# Patient Record
Sex: Male | Born: 1965 | Race: Black or African American | Hispanic: No | Marital: Married | Smoking: Current every day smoker
Health system: Southern US, Community
[De-identification: ages and names within clinical notes are randomized; demographics above are authoritative.]

## PROBLEM LIST (undated history)

## (undated) DIAGNOSIS — M1990 Unspecified osteoarthritis, unspecified site: Secondary | ICD-10-CM

## (undated) DIAGNOSIS — E119 Type 2 diabetes mellitus without complications: Secondary | ICD-10-CM

## (undated) DIAGNOSIS — I10 Essential (primary) hypertension: Secondary | ICD-10-CM

## (undated) HISTORY — PX: OTHER SURGICAL HISTORY: SHX169

---

## 2001-07-12 ENCOUNTER — Emergency Department (HOSPITAL_COMMUNITY): Admission: EM | Admit: 2001-07-12 | Discharge: 2001-07-12 | Payer: Self-pay | Admitting: Emergency Medicine

## 2002-09-07 ENCOUNTER — Encounter: Payer: Self-pay | Admitting: Emergency Medicine

## 2002-09-07 ENCOUNTER — Emergency Department (HOSPITAL_COMMUNITY): Admission: EM | Admit: 2002-09-07 | Discharge: 2002-09-07 | Payer: Self-pay | Admitting: Emergency Medicine

## 2003-02-22 ENCOUNTER — Encounter: Admission: RE | Admit: 2003-02-22 | Discharge: 2003-02-22 | Payer: Self-pay | Admitting: Family Medicine

## 2003-10-23 ENCOUNTER — Emergency Department (HOSPITAL_COMMUNITY): Admission: EM | Admit: 2003-10-23 | Discharge: 2003-10-23 | Payer: Self-pay | Admitting: Emergency Medicine

## 2004-05-09 ENCOUNTER — Emergency Department (HOSPITAL_COMMUNITY): Admission: EM | Admit: 2004-05-09 | Discharge: 2004-05-09 | Payer: Self-pay | Admitting: *Deleted

## 2005-04-30 ENCOUNTER — Emergency Department (HOSPITAL_COMMUNITY): Admission: EM | Admit: 2005-04-30 | Discharge: 2005-04-30 | Payer: Self-pay | Admitting: *Deleted

## 2006-09-11 ENCOUNTER — Emergency Department (HOSPITAL_COMMUNITY): Admission: EM | Admit: 2006-09-11 | Discharge: 2006-09-11 | Payer: Self-pay | Admitting: Emergency Medicine

## 2011-01-01 LAB — BASIC METABOLIC PANEL
BUN: 16
CO2: 25
Calcium: 9.4
Chloride: 106
Creatinine, Ser: 1.21

## 2011-01-01 LAB — POCT CARDIAC MARKERS
CKMB, poc: 1
Troponin i, poc: <0.05

## 2011-01-01 LAB — RAPID URINE DRUG SCREEN, HOSP PERFORMED
Barbiturates: NOT DETECTED
Cocaine: POSITIVE — AB
Opiates: POSITIVE — AB

## 2011-01-01 LAB — CBC
MCHC: 34.6
MCV: 97.6
Platelets: 228
RDW: 13.4

## 2011-01-01 LAB — D-DIMER, QUANTITATIVE: D-Dimer, Quant: <0.22

## 2012-05-01 ENCOUNTER — Encounter (HOSPITAL_COMMUNITY): Payer: Self-pay | Admitting: *Deleted

## 2012-05-01 ENCOUNTER — Emergency Department (HOSPITAL_COMMUNITY)
Admission: EM | Admit: 2012-05-01 | Discharge: 2012-05-01 | Disposition: A | Discharge status: Home / Self Care | Payer: Self-pay | Attending: Emergency Medicine | Admitting: Emergency Medicine

## 2012-05-01 DIAGNOSIS — Z23 Encounter for immunization: Secondary | ICD-10-CM | POA: Insufficient documentation

## 2012-05-01 DIAGNOSIS — S61209A Unspecified open wound of unspecified finger without damage to nail, initial encounter: Secondary | ICD-10-CM | POA: Insufficient documentation

## 2012-05-01 DIAGNOSIS — W268XXA Contact with other sharp object(s), not elsewhere classified, initial encounter: Secondary | ICD-10-CM | POA: Insufficient documentation

## 2012-05-01 DIAGNOSIS — S61011A Laceration without foreign body of right thumb without damage to nail, initial encounter: Secondary | ICD-10-CM

## 2012-05-01 DIAGNOSIS — Y92009 Unspecified place in unspecified non-institutional (private) residence as the place of occurrence of the external cause: Secondary | ICD-10-CM | POA: Insufficient documentation

## 2012-05-01 DIAGNOSIS — F17200 Nicotine dependence, unspecified, uncomplicated: Secondary | ICD-10-CM | POA: Insufficient documentation

## 2012-05-01 DIAGNOSIS — Y93G1 Activity, food preparation and clean up: Secondary | ICD-10-CM | POA: Insufficient documentation

## 2012-05-01 MED ORDER — TETANUS-DIPHTH-ACELL PERTUSSIS 5-2.5-18.5 LF-MCG/0.5 IM SUSP
0.5000 mL | Freq: Once | INTRAMUSCULAR | Status: AC
  Administered 2012-05-01: 0.5 mL via INTRAMUSCULAR
  Filled 2012-05-01: qty 0.5

## 2012-05-01 MED ORDER — NAPROXEN 500 MG PO TABS: 500.0000 mg | ORAL_TABLET | Freq: Two times a day (BID) | ORAL | Status: DC | PRN

## 2012-05-01 NOTE — ED Notes (Signed)
Pt is here with right thumb laceration from steel frying pain.  Bleeding controlled

## 2012-05-01 NOTE — ED Notes (Signed)
Patient is alert and orientedx4.  Patient was explained discharge instructions and he understood them.  Patient is being transported home by his family.

## 2012-05-03 NOTE — ED Provider Notes (Signed)
History     CSN: [id number]  Arrival date & time 05/01/12  1255   First MD Initiated Contact with Patient 05/01/12 1457      Chief Complaint  Patient presents with  . Finger Injury    (Consider location/radiation/quality/duration/timing/severity/associated sxs/prior treatment) HPI Comments: Lance Austin is a 47 y.o. Male presenting with a laceration to his distal right volar thumb when he sliced it on the edge of a metal frying pan while washing it at home.  He denies any persistent pain and has obtained hemostasis with pressure. He denies numbness distal to the laceration site and is utd with his tetanus.  He works as a Artist.     The history is provided by the patient.    History reviewed. No pertinent past medical history.  Past Surgical History  Procedure Laterality Date  . Right arm surgery      No family history on file.  History  Substance Use Topics  . Smoking status: Current Every Day Smoker  . Smokeless tobacco: Not on file  . Alcohol Use: No      Review of Systems  Constitutional: Negative for fever and chills.  HENT: Negative for facial swelling.   Respiratory: Negative for shortness of breath and wheezing.   Skin: Positive for wound.  Neurological: Negative for numbness.    Allergies  Review of patient's allergies indicates no known allergies.  Home Medications   Current Outpatient Rx  Name  Route  Sig  Dispense  Refill  . naproxen (NAPROSYN) 500 MG tablet   Oral   Take 1 tablet (500 mg total) by mouth 2 (two) times daily as needed.   15 tablet   0     BP 138/100  Pulse 86  Temp(Src) 97.2 F (36.2 C) (Oral)  Resp 18  SpO2 97%  Physical Exam  Constitutional: He is oriented to person, place, and time. He appears well-developed and well-nourished.  HENT:  Head: Normocephalic.  Cardiovascular: Normal rate.   Pulmonary/Chest: Effort normal.  Musculoskeletal: He exhibits no edema and no tenderness.   Neurological: He is alert and oriented to person, place, and time. No sensory deficit.  Skin: Laceration noted.  2 cm linear laceration,  Well approximated right volar thumb at tuft.  Less than 2 sec cap refill,  Distal sensation intact.    ED Course  Procedures (including critical care time)  LACERATION REPAIR Performed by: Burgess Amor Authorized by: Burgess Amor Consent: Verbal consent obtained. Risks and benefits: risks, benefits and alternatives were discussed Consent given by: patient Patient identity confirmed: provided demographic data Prepped and Draped in normal sterile fashion Wound explored  Laceration Location: right distal thumb  Laceration Length: 2cm  No Foreign Bodies seen or palpated  Anesthesia: digital block Local anesthetic: lidocaine 2% without epinephrine  Anesthetic total: 1.5 ml  Irrigation method: syringe Amount of cleaning: copious with NS after applying wound cleanser  Skin closure: ethilon 4-0  Number of sutures: 3  Technique: simple interrupted  Patient tolerance: Patient tolerated the procedure well with no immediate complications.   Labs Reviewed - No data to display No results found.   1. Laceration of right thumb       MDM  Suture removal in 10 days.  Signs/sx of infection discussed, return for any complications for re-eval.  Pt understands plan. Soft dressing applied by RN.  Prescribed naproxen prn pain.        Burgess Amor, PA 05/03/12 1004

## 2012-05-05 NOTE — ED Provider Notes (Signed)
Medical screening examination/treatment/procedure(s) were performed by non-physician practitioner and as supervising physician I was immediately available for consultation/collaboration.   Kristin Lamagna, MD 05/05/12 1507 

## 2013-12-10 ENCOUNTER — Emergency Department (HOSPITAL_COMMUNITY): Payer: Self-pay

## 2013-12-10 ENCOUNTER — Encounter (HOSPITAL_COMMUNITY): Payer: Self-pay | Admitting: Emergency Medicine

## 2013-12-10 ENCOUNTER — Emergency Department (HOSPITAL_COMMUNITY)
Admission: EM | Admit: 2013-12-10 | Discharge: 2013-12-10 | Disposition: A | Discharge status: Home / Self Care | Payer: Self-pay | Attending: Emergency Medicine | Admitting: Emergency Medicine

## 2013-12-10 DIAGNOSIS — W010XXA Fall on same level from slipping, tripping and stumbling without subsequent striking against object, initial encounter: Secondary | ICD-10-CM | POA: Insufficient documentation

## 2013-12-10 DIAGNOSIS — F17200 Nicotine dependence, unspecified, uncomplicated: Secondary | ICD-10-CM | POA: Insufficient documentation

## 2013-12-10 DIAGNOSIS — S42143A Displaced fracture of glenoid cavity of scapula, unspecified shoulder, initial encounter for closed fracture: Secondary | ICD-10-CM | POA: Insufficient documentation

## 2013-12-10 DIAGNOSIS — S42141A Displaced fracture of glenoid cavity of scapula, right shoulder, initial encounter for closed fracture: Secondary | ICD-10-CM

## 2013-12-10 DIAGNOSIS — S42151A Displaced fracture of neck of scapula, right shoulder, initial encounter for closed fracture: Secondary | ICD-10-CM

## 2013-12-10 DIAGNOSIS — S42153A Displaced fracture of neck of scapula, unspecified shoulder, initial encounter for closed fracture: Principal | ICD-10-CM

## 2013-12-10 DIAGNOSIS — Y9289 Other specified places as the place of occurrence of the external cause: Secondary | ICD-10-CM | POA: Insufficient documentation

## 2013-12-10 DIAGNOSIS — S46909A Unspecified injury of unspecified muscle, fascia and tendon at shoulder and upper arm level, unspecified arm, initial encounter: Secondary | ICD-10-CM | POA: Insufficient documentation

## 2013-12-10 DIAGNOSIS — Y9389 Activity, other specified: Secondary | ICD-10-CM | POA: Insufficient documentation

## 2013-12-10 DIAGNOSIS — S4980XA Other specified injuries of shoulder and upper arm, unspecified arm, initial encounter: Secondary | ICD-10-CM | POA: Insufficient documentation

## 2013-12-10 MED ORDER — ONDANSETRON 4 MG PO TBDP
4.0000 mg | ORAL_TABLET | Freq: Once | ORAL | Status: AC
  Administered 2013-12-10: 4 mg via ORAL
  Filled 2013-12-10: qty 1

## 2013-12-10 MED ORDER — MORPHINE SULFATE 10 MG/ML IJ SOLN
10.0000 mg | Freq: Once | INTRAMUSCULAR | Status: AC
Start: 2013-12-10 — End: 2013-12-10
  Administered 2013-12-10: 10 mg via INTRAMUSCULAR
  Filled 2013-12-10: qty 1

## 2013-12-10 MED ORDER — OXYCODONE-ACETAMINOPHEN 5-325 MG PO TABS: 2.0000 | ORAL_TABLET | ORAL | Status: DC | PRN

## 2013-12-10 MED ORDER — OXYCODONE-ACETAMINOPHEN 5-325 MG PO TABS
1.0000 | ORAL_TABLET | Freq: Once | ORAL | Status: AC
  Administered 2013-12-10: 1 via ORAL
  Filled 2013-12-10: qty 1

## 2013-12-10 NOTE — ED Notes (Signed)
MD at bedside. 

## 2013-12-10 NOTE — Discharge Instructions (Signed)
Sling can be removed for bathing, etc. Call Dr. Shelle Iron (Ortopedic surgeon) for a re check appointment. Scapular Fracture You have a fracture (break in bone) of your scapula. This is your shoulder blade. It is the large flat bone behind your shoulder. This is also the bone that makes up the ball and socket joint of your shoulder. Most of the time surgery is not required for injuries to this bone unless the socket of the shoulder joint is involved. DIAGNOSIS  Because of the severity of force usually required to break this bone, x-rays are often taken of other bones likely to be injured at the same time. X-rays of the hip, knee, and pelvis may be taken. Specialized x-rays (arteriograms) may be needed if there are injuries to large blood vessels associated with this injury. HOME CARE INSTRUCTIONS   Simple fractures of the scapula can be treated with a sling and swathe type of immobilization. This means the involved area is held in place by putting the arm in a sling. A wrap is made around the upper arm with the sling holding the arm next to the chest. This may be removed for bathing as instructed by your caregiver.  Apply ice to the injury for 15-20 minutes 03-04 times per day. Put the ice in a plastic bag. Place a towel between the bag of ice and your skin, splint, or immobilization device.  Do not resume use until instructed by your caregiver. Usually full rehabilitation (exercises to improve the injury site) will begin sometime after the sling and swathe are removed. Then begin use gradually as directed. Do not increase use to the point of pain. If pain develops, decrease use and continue the above measures. Slowly increase activities that do not cause discomfort until you gradually achieve normal use without pain.  Only take over-the-counter or prescription medicines for pain, discomfort, or fever as directed by your caregiver. SEEK IMMEDIATE MEDICAL CARE IF:   Your pain and swelling increase and is  uncontrolled with medications.  You develop new, unexplained symptoms or an increase of the symptoms which brought you to your caregiver.  You develop shortness or breath or cough up blood.  You are unable to move your arm or fingers. You develop warmth and swelling in your affected arm.  You develop an unexplained temperature. Document Released: 03/03/2005 Document Revised: 05/26/2011 Document Reviewed: 01/23/2006 [facility] Patient Information 2015 [place], [place]. This information is not intended to replace advice given to you by your health care provider. Make sure you discuss any questions you have with your health care provider.

## 2013-12-10 NOTE — ED Notes (Signed)
Patient arrives with complaint of right shoulder pain secondary to falling on outstretched hand. States that he was "jogging to the bathroom" and fell. Since that time shoulder has been very painful to move. Denies increase in pain with palpation. Patient unable to lift arm above mid abdomen straightening arm increases pain.

## 2013-12-10 NOTE — ED Provider Notes (Signed)
CSN: [id number]     Arrival date & time 12/10/13  0524 History   First MD Initiated Contact with Patient 12/10/13 [phone]     Chief Complaint  Patient presents with  . Shoulder Pain      HPI  Patient presents with right shoulder pain. He was going quickly to the bathroom at 10:00 last night when he tripped and fell on outstretched arm he complains of pain in the shoulder. Painful to move. He slept poorly because of this. No areas of pain to the distal upper extremity. No injury to the head neck back or chest.  History reviewed. No pertinent past medical history. Past Surgical History  Procedure Laterality Date  . Right arm surgery     No family history on file. History  Substance Use Topics  . Smoking status: Current Every Day Smoker -- 0.33 packs/day    Types: Cigarettes  . Smokeless tobacco: Not on file  . Alcohol Use: No    Review of Systems  Constitutional: Negative for fever, chills, diaphoresis, appetite change and fatigue.  HENT: Negative for mouth sores, sore throat and trouble swallowing.   Eyes: Negative for visual disturbance.  Respiratory: Negative for cough, chest tightness, shortness of breath and wheezing.   Cardiovascular: Negative for chest pain.  Gastrointestinal: Negative for nausea, vomiting, abdominal pain, diarrhea and abdominal distention.  Endocrine: Negative for polydipsia, polyphagia and polyuria.  Genitourinary: Negative for dysuria, frequency and hematuria.  Musculoskeletal: Positive for arthralgias and myalgias. Negative for gait problem.  Skin: Negative for color change, pallor and rash.  Neurological: Negative for dizziness, syncope, light-headedness and headaches.  Hematological: Does not bruise/bleed easily.  Psychiatric/Behavioral: Negative for behavioral problems and confusion.      Allergies  Review of patient's allergies indicates no known allergies.  Home Medications   Prior to Admission medications   Medication Sig Start Date End  Date Taking? Authorizing Provider  ibuprofen (ADVIL,MOTRIN) 200 MG tablet Take 200 mg by mouth every 6 (six) hours as needed for moderate pain.   Yes Historical Provider, MD  oxyCODONE-acetaminophen (PERCOCET/ROXICET) 5-325 MG per tablet Take 2 tablets by mouth every 4 (four) hours as needed. 12/10/13   Rolland Porter, MD   BP 130/92  Pulse 85  Temp(Src) 98.5 F (36.9 C) (Oral)  Resp 20  Ht  (1.88 m)  Wt 240 lb (108.863 kg)  BMI 30.80 kg/m2  SpO2 94% Physical Exam  Constitutional: He is oriented to person, place, and time. He appears well-developed and well-nourished. No distress.  HENT:  Head: Normocephalic.  Eyes: Conjunctivae are normal. Pupils are equal, round, and reactive to light. No scleral icterus.  Neck: Normal range of motion. Neck supple. No thyromegaly present.  Cardiovascular: Normal rate and regular rhythm.  Exam reveals no gallop and no friction rub.   No murmur heard. Pulmonary/Chest: Effort normal and breath sounds normal. No respiratory distress. He has no wheezes. He has no rales.  Abdominal: Soft. Bowel sounds are normal. He exhibits no distension. There is no tenderness. There is no rebound.  Musculoskeletal: Normal range of motion.       Arms: Neurological: He is alert and oriented to person, place, and time.  Skin: Skin is warm and dry. No rash noted.  Psychiatric: He has a normal mood and affect. His behavior is normal.    ED Course  Procedures (including critical care time) Labs Review Labs Reviewed - No data to display  Imaging Review Dg Shoulder Right  12/10/2013   CLINICAL  DATA:  Right shoulder pain.  Slipped and fell yesterday.  EXAM: RIGHT SHOULDER - 2+ VIEW  COMPARISON:  None.  FINDINGS: There is a Bankart type fracture of the inferior glenoid rim. No evidence of shoulder dislocation. Slightly increased subacromial space is probably projectional. Soft tissues are unremarkable.  IMPRESSION: Bankart type fracture of the inferior glenoid rim. No  glenohumeral dislocation is appreciated.   Electronically Signed   By: Burman Nieves M.D.   On: 12/10/2013 06:27     EKG Interpretation None      MDM   Final diagnoses:  Glenoid fracture of shoulder, right, closed, initial encounter     X-rays reviewed with patient.  Inferior Glenoid Fracture demonstrated.  Placed into sling. Discussed limited use and ortho follow up.  Given IM meds for pain.   Rolland Porter, MD 12/10/13 [phone]

## 2015-10-14 ENCOUNTER — Encounter (HOSPITAL_COMMUNITY): Payer: Self-pay

## 2015-10-14 ENCOUNTER — Emergency Department (HOSPITAL_COMMUNITY)
Admission: EM | Admit: 2015-10-14 | Discharge: 2015-10-14 | Disposition: A | Discharge status: Home / Self Care | Payer: No Typology Code available for payment source | Attending: Emergency Medicine | Admitting: Emergency Medicine

## 2015-10-14 DIAGNOSIS — Y939 Activity, unspecified: Secondary | ICD-10-CM | POA: Diagnosis not present

## 2015-10-14 DIAGNOSIS — F17210 Nicotine dependence, cigarettes, uncomplicated: Secondary | ICD-10-CM | POA: Diagnosis not present

## 2015-10-14 DIAGNOSIS — S40812A Abrasion of left upper arm, initial encounter: Secondary | ICD-10-CM | POA: Diagnosis not present

## 2015-10-14 DIAGNOSIS — M79622 Pain in left upper arm: Secondary | ICD-10-CM

## 2015-10-14 DIAGNOSIS — Y999 Unspecified external cause status: Secondary | ICD-10-CM | POA: Insufficient documentation

## 2015-10-14 DIAGNOSIS — Y92410 Unspecified street and highway as the place of occurrence of the external cause: Secondary | ICD-10-CM | POA: Diagnosis not present

## 2015-10-14 DIAGNOSIS — S4992XA Unspecified injury of left shoulder and upper arm, initial encounter: Secondary | ICD-10-CM | POA: Diagnosis present

## 2015-10-14 MED ORDER — METHOCARBAMOL 500 MG PO TABS: 500.0000 mg | ORAL_TABLET | Freq: Two times a day (BID) | ORAL | 0 refills | Status: DC

## 2015-10-14 MED ORDER — IBUPROFEN 600 MG PO TABS: 600.0000 mg | ORAL_TABLET | Freq: Four times a day (QID) | ORAL | 0 refills | Status: DC | PRN

## 2015-10-14 MED ORDER — KETOROLAC TROMETHAMINE 30 MG/ML IJ SOLN
30.0000 mg | Freq: Once | INTRAMUSCULAR | Status: AC
  Administered 2015-10-14: 30 mg via INTRAMUSCULAR
  Filled 2015-10-14: qty 1

## 2015-10-14 NOTE — Discharge Instructions (Signed)
Take your medications as prescribed and as we discussed. Follow up with your doctor as needed. Return to ED for new or worsening symptoms.
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
## 2017-07-28 ENCOUNTER — Other Ambulatory Visit: Payer: Self-pay

## 2017-07-28 ENCOUNTER — Emergency Department (HOSPITAL_COMMUNITY): Payer: Self-pay

## 2017-07-28 ENCOUNTER — Emergency Department (HOSPITAL_BASED_OUTPATIENT_CLINIC_OR_DEPARTMENT_OTHER)
Admit: 2017-07-28 | Discharge: 2017-07-28 | Disposition: A | Discharge status: Home / Self Care | Payer: Self-pay | Attending: Emergency Medicine | Admitting: Emergency Medicine

## 2017-07-28 ENCOUNTER — Emergency Department (HOSPITAL_COMMUNITY)
Admission: EM | Admit: 2017-07-28 | Discharge: 2017-07-28 | Disposition: A | Discharge status: Home / Self Care | Payer: Self-pay | Attending: Emergency Medicine | Admitting: Emergency Medicine

## 2017-07-28 ENCOUNTER — Encounter (HOSPITAL_COMMUNITY): Payer: Self-pay | Admitting: Emergency Medicine

## 2017-07-28 DIAGNOSIS — M79652 Pain in left thigh: Secondary | ICD-10-CM | POA: Insufficient documentation

## 2017-07-28 DIAGNOSIS — Z79899 Other long term (current) drug therapy: Secondary | ICD-10-CM | POA: Insufficient documentation

## 2017-07-28 DIAGNOSIS — F17210 Nicotine dependence, cigarettes, uncomplicated: Secondary | ICD-10-CM | POA: Insufficient documentation

## 2017-07-28 DIAGNOSIS — M79609 Pain in unspecified limb: Secondary | ICD-10-CM

## 2017-07-28 DIAGNOSIS — R52 Pain, unspecified: Secondary | ICD-10-CM

## 2017-07-28 NOTE — ED Triage Notes (Signed)
Pt reports pain in left thigh, states he was not working for a while and now he is standing on his feet a lot for his job, states after standing for a while his thigh feels numb and hot.

## 2017-07-28 NOTE — Discharge Instructions (Addendum)
Please read attached information. If you experience any new or worsening signs or symptoms please return to the emergency room for evaluation. Please follow-up with your primary care provider or specialist as discussed.  °

## 2017-07-28 NOTE — ED Notes (Signed)
Pt to vascular.

## 2017-07-28 NOTE — Progress Notes (Signed)
*  Preliminary Results* Left lower extremity venous duplex completed. Left lower extremity is negative for deep vein thrombosis. There is no evidence of left Baker's cyst.  07/28/2017 2:27 PM  Gertie Fey, BS, RVT, RDCS, RDMS

## 2017-07-28 NOTE — ED Provider Notes (Signed)
[facility] EMERGENCY DEPARTMENT Provider Note   CSN: [id number] Arrival date & time: 07/28/17  1011     History   Chief Complaint Chief Complaint  Patient presents with  . Leg Pain    HPI SKANDA WORLDS is a 52 y.o. male.  HPI   52 year old male presents today with complaints of left thigh pain.  Patient notes he recently went back to work and is standing for prolonged periods of time.  He notes that he has pain in his left anterior thigh, has some numbness along the anterior aspect of the thigh that comes and goes and is improved with movement.  He denies any lower extremity swelling or edema, denies any pain to the posterior aspect of the thigh or calf, denies any history of DVT or PE.  Patient does note he is a smoker.  Denies any trauma to the lower extremity.  He does note very minor lower back pain intermittent, nonsevere.  As of strength or motor function of the lower extremity.  History reviewed. No pertinent past medical history.  There are no active problems to display for this patient.   Past Surgical History:  Procedure Laterality Date  . right arm surgery          Home Medications    Prior to Admission medications   Medication Sig Start Date End Date Taking? Authorizing Provider  ibuprofen (ADVIL,MOTRIN) 200 MG tablet Take 200 mg by mouth every 6 (six) hours as needed for moderate pain.    [provider]  ibuprofen (ADVIL,MOTRIN) 600 MG tablet Take 1 tablet (600 mg total) by mouth every 6 (six) hours as needed. 10/14/15   Cartner, Sharlet Salina, PA-C  methocarbamol (ROBAXIN) 500 MG tablet Take 1 tablet (500 mg total) by mouth 2 (two) times daily. 10/14/15   Joycie Peek, PA-C  oxyCODONE-acetaminophen (PERCOCET/ROXICET) 5-325 MG per tablet Take 2 tablets by mouth every 4 (four) hours as needed. 12/10/13   Rolland Porter, MD    Family History No family history on file.  Social History Social History   Tobacco Use  . Smoking  status: Current Every Day Smoker    Packs/day: 0.33    Types: Cigarettes  . Smokeless tobacco: Never Used  Substance Use Topics  . Alcohol use: No  . Drug use: No     Allergies   Patient has no known allergies.   Review of Systems Review of Systems  All other systems reviewed and are negative.    Physical Exam Updated Vital Signs BP (!) 147/84 (BP Location: Right Arm)   Pulse 75   Temp 98.7 F (37.1 C) (Oral)   Resp 20   SpO2 100%   Physical Exam  Constitutional: He is oriented to person, place, and time. He appears well-developed and well-nourished.  HENT:  Head: Normocephalic and atraumatic.  Eyes: Pupils are equal, round, and reactive to light. Conjunctivae are normal. Right eye exhibits no discharge. Left eye exhibits no discharge. No scleral icterus.  Neck: Normal range of motion. No JVD present. No tracheal deviation present.  Pulmonary/Chest: Effort normal. No stridor.  Musculoskeletal:  No CT or L-spine tenderness palpation, minor tenderness palpation of left lateral lumbar musculature-left leg atraumatic no swelling or edema, no warmth to touch, nontender full active range of motion no pain with hip flexion extension, extensor flexion of the knee, joint compartments are soft   Neurological: He is alert and oriented to person, place, and time. Coordination normal.  Psychiatric: He has a  normal mood and affect. His behavior is normal. Judgment and thought content normal.  Nursing note and vitals reviewed.    ED Treatments / Results  Labs (all labs ordered are listed, but only abnormal results are displayed) Labs Reviewed - No data to display  EKG None  Radiology Dg Femur Min 2 Views Left  Result Date: 07/28/2017 CLINICAL DATA:  Left femur pain for 1 month without known injury. EXAM: LEFT FEMUR 2 VIEWS COMPARISON:  None. FINDINGS: There is no evidence of fracture or other focal bone lesions. Soft tissues are unremarkable. IMPRESSION: Normal left femur.  Electronically Signed   By: Lupita Raider, M.D.   On: 07/28/2017 12:40    Procedures Procedures (including critical care time)  Medications Ordered in ED Medications - No data to display   Initial Impression / Assessment and Plan / ED Course  I have reviewed the triage vital signs and the nursing notes.  Pertinent labs & imaging results that were available during my care of the patient were reviewed by me and considered in my medical decision making (see chart for details).     52 year old male presents today with complaints of thigh pain.  This is likely muscular in nature, DVT study with no acute DVT x-ray without acute finding.  No signs of infection or swelling on my exam.  Patient does have some minor lower back pain question some radiation of symptoms, no significant deficits here today.  Patient will be encouraged to follow-up as an outpatient, he is given strict return precautions, he verbalized understanding and agreement to today's plan had no further questions or concerns at time of discharge.  Final Clinical Impressions(s) / ED Diagnoses   Final diagnoses:  Pain of left thigh    ED Discharge Orders    None       Rosalio Loud 07/28/17 1651    Rolland Porter, MD 08/03/17 [phone]

## 2018-01-29 ENCOUNTER — Other Ambulatory Visit: Payer: Self-pay

## 2018-01-29 ENCOUNTER — Ambulatory Visit (HOSPITAL_COMMUNITY)
Admission: EM | Admit: 2018-01-29 | Discharge: 2018-01-29 | Disposition: A | Discharge status: Home / Self Care | Payer: BLUE CROSS/BLUE SHIELD | Attending: Family Medicine | Admitting: Family Medicine

## 2018-01-29 ENCOUNTER — Encounter (HOSPITAL_COMMUNITY): Payer: Self-pay | Admitting: Emergency Medicine

## 2018-01-29 DIAGNOSIS — M25561 Pain in right knee: Secondary | ICD-10-CM

## 2018-01-29 MED ORDER — TRAMADOL HCL 50 MG PO TABS: 50.0000 mg | ORAL_TABLET | Freq: Four times a day (QID) | ORAL | 0 refills | Status: DC | PRN

## 2018-01-29 MED ORDER — DICLOFENAC SODIUM 75 MG PO TBEC: 75.0000 mg | DELAYED_RELEASE_TABLET | Freq: Two times a day (BID) | ORAL | 0 refills | Status: DC

## 2018-01-29 NOTE — ED Provider Notes (Signed)
[facility]   [id number] 01/29/18 Arrival Time: 1638  ASSESSMENT & PLAN:  1. Right knee pain, unspecified chronicity   Non-traumatic. 3-4 months. MCL distribution.  Meds ordered this encounter  Medications  . diclofenac (VOLTAREN) 75 MG EC tablet    Sig: Take 1 tablet (75 mg total) by mouth 2 (two) times daily.    Dispense:  20 tablet    Refill:  0  . traMADol (ULTRAM) 50 MG tablet    Sig: Take 1 tablet (50 mg total) by mouth every 6 (six) hours as needed.    Dispense:  12 tablet    Refill:  0   Orders Placed This Encounter  Procedures  . Apply knee sleeve   Follow-up Information    Schedule an appointment as soon as possible for a visit  with [facility]   Specialty:  Orthopedic Surgery Contact information: [address] [place] [address] [phone]          Rest the injured area as much as practical.  [place] Controlled Substances Registry consulted for this patient. I feel the risk/benefit ratio today is favorable for proceeding with this prescription for a controlled substance. Medication sedation precautions given.  Reviewed expectations re: course of current medical issues. Questions answered. Outlined signs and symptoms indicating need for more acute intervention. Patient verbalized understanding. After Visit Summary given.  SUBJECTIVE: History from: patient. Lance Austin is a 52 y.o. male who reports intermittent mild to moderate pain of his right medial knee pain; described as aching without radiation. Injury/trama: no. Onset: gradual, over the past 3-4 months. Stands on concrete at work; questions relation. Symptoms have been intermittent since beginning. More persistent over the past few weeks. Relieved by: rest. Worsened by: prolonged standing/walking. Associated symptoms: none reported. No swelling of R knee. Extremity sensation changes or weakness: none. Self treatment: has not tried OTCs for relief  of pain. History of similar: no.  Past Surgical History:  Procedure Laterality Date  . right arm surgery       ROS: As per HPI.   OBJECTIVE:  Vitals:   01/29/18 1731  BP: (!) 132/91  Pulse: (!) 103  Temp: 98.6 F (37 C)  TempSrc: Oral  SpO2: 100%   Recheck pulse: 96 and regular  General appearance: alert; no distress Extremities: . RLE: warm and well perfused; fairly well localized moderate tenderness over right medial knee, over MCL distribution; without gross deformities; with no swelling; with no bruising; ROM: normal CV: brisk extremity capillary refill of RLE and LLE; 2+ DP and PT pulse of RLE and LLE. Skin: warm and dry; no visible rashes Neurologic: gait normal; normal reflexes of RLE and LLE; normal sensation of RLE and LLE; normal strength of RLE and LLE Psychological: alert and cooperative; normal mood and affect  No Known Allergies  History reviewed. No pertinent past medical history. Social History   Socioeconomic History  . Marital status: Married    Spouse name: Not on file  . Number of children: Not on file  . Years of education: Not on file  . Highest education level: Not on file  Occupational History  . Not on file  Social Needs  . Financial resource strain: Not on file  . Food insecurity:    Worry: Not on file    Inability: Not on file  . Transportation needs:    Medical: Not on file    Non-medical: Not on file  Tobacco Use  . Smoking status:  Current Every Day Smoker    Packs/day: 0.33    Types: Cigarettes  . Smokeless tobacco: Never Used  Substance and Sexual Activity  . Alcohol use: No  . Drug use: No  . Sexual activity: Not on file  Lifestyle  . Physical activity:    Days per week: Not on file    Minutes per session: Not on file  . Stress: Not on file  Relationships  . Social connections:    Talks on phone: Not on file    Gets together: Not on file    Attends religious service: Not on file    Active member of club or  organization: Not on file    Attends meetings of clubs or organizations: Not on file    Relationship status: Not on file  Other Topics Concern  . Not on file  Social History Narrative  . Not on file   History reviewed. No pertinent family history. Past Surgical History:  Procedure Laterality Date  . right arm surgery        Mardella Layman, MD 01/29/18 [phone]

## 2018-01-29 NOTE — ED Triage Notes (Signed)
Pt has been suffering from right knee pain for four months.  Pt states it recently has been keeping him up at night.  He denies any injury to the knee.

## 2018-04-01 ENCOUNTER — Encounter (HOSPITAL_COMMUNITY): Payer: Self-pay | Admitting: Emergency Medicine

## 2018-04-01 ENCOUNTER — Ambulatory Visit (HOSPITAL_COMMUNITY)
Admission: EM | Admit: 2018-04-01 | Discharge: 2018-04-01 | Disposition: A | Discharge status: Home / Self Care | Payer: BLUE CROSS/BLUE SHIELD | Attending: Family Medicine | Admitting: Family Medicine

## 2018-04-01 ENCOUNTER — Ambulatory Visit (INDEPENDENT_AMBULATORY_CARE_PROVIDER_SITE_OTHER): Payer: BLUE CROSS/BLUE SHIELD

## 2018-04-01 DIAGNOSIS — R030 Elevated blood-pressure reading, without diagnosis of hypertension: Secondary | ICD-10-CM | POA: Insufficient documentation

## 2018-04-01 DIAGNOSIS — R0602 Shortness of breath: Secondary | ICD-10-CM | POA: Diagnosis not present

## 2018-04-01 DIAGNOSIS — J22 Unspecified acute lower respiratory infection: Secondary | ICD-10-CM | POA: Insufficient documentation

## 2018-04-01 DIAGNOSIS — R05 Cough: Secondary | ICD-10-CM

## 2018-04-01 MED ORDER — IPRATROPIUM BROMIDE 0.06 % NA SOLN: 2.0000 | Freq: Four times a day (QID) | NASAL | 12 refills | Status: DC

## 2018-04-01 MED ORDER — AMOXICILLIN 875 MG PO TABS: 875.0000 mg | ORAL_TABLET | Freq: Two times a day (BID) | ORAL | 0 refills | Status: DC

## 2018-04-01 MED ORDER — BENZONATATE 200 MG PO CAPS: 200.0000 mg | ORAL_CAPSULE | Freq: Two times a day (BID) | ORAL | 0 refills | Status: DC | PRN

## 2018-04-01 NOTE — ED Triage Notes (Signed)
Pt presents to [facility] for assessment of cough, congestion, body aches, headaches, and chills since Sunday.

## 2018-04-01 NOTE — Discharge Instructions (Signed)
Rest Push fluids Take the amoxil 2 x a day Take the tessalon 2 x a day for cough Use the nasal spray for the running nose Avoid cold medicine with decongestants due to elevated blood pressure Follow up with a PCP

## 2018-04-01 NOTE — ED Provider Notes (Signed)
[facility]    CSN: [id number] Arrival date & time: 04/01/18  1110     History   Chief Complaint Chief Complaint  Patient presents with  . Flu-Like Symptoms    HPI Lance Austin is a 53 y.o. male.   HPI  Patient states that he has had congestion, body aches, cough, headaches, chills and fever since Saturday.  This is been going on for 5 days.  He states that he is coughing up a lot of congestion, is feeling very tired.  He states that he is a smoker and has been told he has COPD.  He does not have a primary care doctor but his prior doctor told him that he needed antibiotics when he had chest congestion.  He has some nausea but no vomiting.  He is very tired.  He has been able to work.  History reviewed. No pertinent past medical history.  There are no active problems to display for this patient.   Past Surgical History:  Procedure Laterality Date  . right arm surgery         Home Medications    Prior to Admission medications   Medication Sig Start Date End Date Taking? Authorizing Provider  amoxicillin (AMOXIL) 875 MG tablet Take 1 tablet (875 mg total) by mouth 2 (two) times daily. 04/01/18   Eustace MooreNelson, Sheera Illingworth Sue, MD  benzonatate (TESSALON) 200 MG capsule Take 1 capsule (200 mg total) by mouth 2 (two) times daily as needed for cough. 04/01/18   Eustace MooreNelson, Lucius Wise Sue, MD  diclofenac (VOLTAREN) 75 MG EC tablet Take 1 tablet (75 mg total) by mouth 2 (two) times daily. 01/29/18   Mardella LaymanHagler, Brian, MD  ipratropium (ATROVENT) 0.06 % nasal spray Place 2 sprays into both nostrils 4 (four) times daily. 04/01/18   Eustace MooreNelson, Shamecca Whitebread Sue, MD  traMADol (ULTRAM) 50 MG tablet Take 1 tablet (50 mg total) by mouth every 6 (six) hours as needed. 01/29/18   Mardella LaymanHagler, Brian, MD    Family History History reviewed. No pertinent family history.  Social History Social History   Tobacco Use  . Smoking status: Current Every Day Smoker    Packs/day: 0.33    Types: Cigarettes  . Smokeless  tobacco: Never Used  Substance Use Topics  . Alcohol use: No  . Drug use: No     Allergies   Patient has no known allergies.   Review of Systems Review of Systems  Constitutional: Positive for chills, fatigue and fever.  HENT: Positive for congestion. Negative for ear pain and sore throat.   Eyes: Negative for pain and visual disturbance.  Respiratory: Positive for cough and wheezing. Negative for shortness of breath.   Cardiovascular: Negative for chest pain and palpitations.  Gastrointestinal: Negative for abdominal pain and vomiting.  Genitourinary: Negative for dysuria and hematuria.  Musculoskeletal: Positive for myalgias. Negative for arthralgias and back pain.  Skin: Negative for color change and rash.  Neurological: Negative for seizures and syncope.  All other systems reviewed and are negative.    Physical Exam Triage Vital Signs ED Triage Vitals  Enc Vitals Group     BP 04/01/18 1143 (!) 169/110     Pulse Rate 04/01/18 1143 78     Resp 04/01/18 1143 20     Temp 04/01/18 1143 97.6 F (36.4 C)     Temp Source 04/01/18 1143 Temporal     SpO2 04/01/18 1143 100 %     Weight --      Height --  Head Circumference --      Peak Flow --      Pain Score 04/01/18 1144 10     Pain Loc --      Pain Edu? --      Excl. in GC? --    No data found.  Updated Vital Signs BP (!) 169/110 (BP Location: Right Arm)   Pulse 78   Temp 97.6 F (36.4 C) (Temporal)   Resp 20   SpO2 100%     Physical Exam Constitutional:      General: He is not in acute distress.    Appearance: He is well-developed.  HENT:     Head: Normocephalic and atraumatic.     Right Ear: Tympanic membrane, ear canal and external ear normal.     Left Ear: Tympanic membrane, ear canal and external ear normal.     Nose: Rhinorrhea present.     Mouth/Throat:     Pharynx: Posterior oropharyngeal erythema present.  Eyes:     Conjunctiva/sclera: Conjunctivae normal.     Pupils: Pupils are equal,  round, and reactive to light.  Neck:     Musculoskeletal: Normal range of motion and neck supple.  Cardiovascular:     Rate and Rhythm: Normal rate and regular rhythm.     Heart sounds: Normal heart sounds.  Pulmonary:     Effort: Pulmonary effort is normal. No respiratory distress.     Breath sounds: Wheezing and rhonchi present.     Comments: Coarse lung sounds, scattered rhonchi, few wheeze Abdominal:     General: Abdomen is flat. There is no distension.     Palpations: Abdomen is soft.  Musculoskeletal: Normal range of motion.  Skin:    General: Skin is warm and dry.  Neurological:     General: No focal deficit present.     Mental Status: He is alert. Mental status is at baseline.  Psychiatric:        Mood and Affect: Mood normal.        Thought Content: Thought content normal.      UC Treatments / Results  Labs (all labs ordered are listed, but only abnormal results are displayed) Labs Reviewed - No data to display  EKG None  Radiology Dg Chest 2 View  Result Date: 04/01/2018 CLINICAL DATA:  Dry cough, shortness of breath and decreased appetite for 1 week. EXAM: CHEST - 2 VIEW COMPARISON:  PA and lateral chest 09/11/2006. FINDINGS: Mild, chronic coarsening of the pulmonary interstitium is seen. Lungs clear. No pneumothorax or pleural effusion. Aortic atherosclerosis is noted. Heart size is normal. No acute or focal bony abnormality. IMPRESSION: No acute disease. Atherosclerosis. Electronically Signed   By: Drusilla Kanner M.D.   On: 04/01/2018 13:10    Procedures Procedures (including critical care time)  Medications Ordered in UC Medications - No data to display  Initial Impression / Assessment and Plan / UC Course  I have reviewed the triage vital signs and the nursing notes.  Pertinent labs & imaging results that were available during my care of the patient were reviewed by me and considered in my medical decision making (see chart for details).     I  discussed with the patient that he needs to quit smoking.  He has a lower respiratory tract infection.  It is likely caused by a virus, but given his lung congestion and abnormal lung sounds it is not unreasonable to give him an antibiotic.  He has atherosclerotic disease and hypertension.  He needs to see a PCP for this.  His blood pressure is not dangerously elevated, but it is been high the last couple of visits and he needs to follow-up on this.  Today, I am not going to offer him treatment, because he is sleep deprived and taking cold medications which may contribute to the elevated pressure. Final Clinical Impressions(s) / UC Diagnoses   Final diagnoses:  LRTI (lower respiratory tract infection)  Elevated blood pressure reading without diagnosis of hypertension     Discharge Instructions     Rest Push fluids Take the amoxil 2 x a day Take the tessalon 2 x a day for cough Use the nasal spray for the running nose Avoid cold medicine with decongestants due to elevated blood pressure Follow up with a PCP   ED Prescriptions    Medication Sig Dispense Auth. Provider   amoxicillin (AMOXIL) 875 MG tablet Take 1 tablet (875 mg total) by mouth 2 (two) times daily. 14 tablet Eustace MooreNelson, Ranyia Witting Sue, MD   benzonatate (TESSALON) 200 MG capsule Take 1 capsule (200 mg total) by mouth 2 (two) times daily as needed for cough. 20 capsule Eustace MooreNelson, Gyasi Hazzard Sue, MD   ipratropium (ATROVENT) 0.06 % nasal spray Place 2 sprays into both nostrils 4 (four) times daily. 15 mL Eustace MooreNelson, Adreanna Fickel Sue, MD     Controlled Substance Prescriptions [place] Controlled Substance Registry consulted? Not Applicable   Eustace MooreNelson, Dezmen Alcock Sue, MD 04/01/18 2152

## 2020-01-27 ENCOUNTER — Ambulatory Visit
Admission: RE | Admit: 2020-01-27 | Discharge: 2020-01-27 | Disposition: A | Discharge status: Home / Self Care | Payer: Disability Insurance | Source: Ambulatory Visit | Attending: Dentistry | Admitting: Dentistry

## 2020-01-27 ENCOUNTER — Other Ambulatory Visit: Payer: Self-pay | Admitting: Dentistry

## 2020-01-27 DIAGNOSIS — M79661 Pain in right lower leg: Secondary | ICD-10-CM | POA: Insufficient documentation

## 2020-01-27 DIAGNOSIS — M545 Low back pain, unspecified: Secondary | ICD-10-CM

## 2020-01-27 DIAGNOSIS — M79662 Pain in left lower leg: Secondary | ICD-10-CM

## 2020-01-27 DIAGNOSIS — R200 Anesthesia of skin: Secondary | ICD-10-CM | POA: Diagnosis present

## 2020-11-23 IMAGING — CR DG LUMBAR SPINE 2-3V
1 series · 3 of 3 positions shown · non-contrast
Comparison: None.

CLINICAL DATA: Bilateral low back pain and numbness in the legs

EXAM:
LUMBAR SPINE - 2-3 VIEW

[Series 1: dg lumbar spine 2-3 views · 0.14mm/px · 3 of 3 slices shown]
[im 1/3]
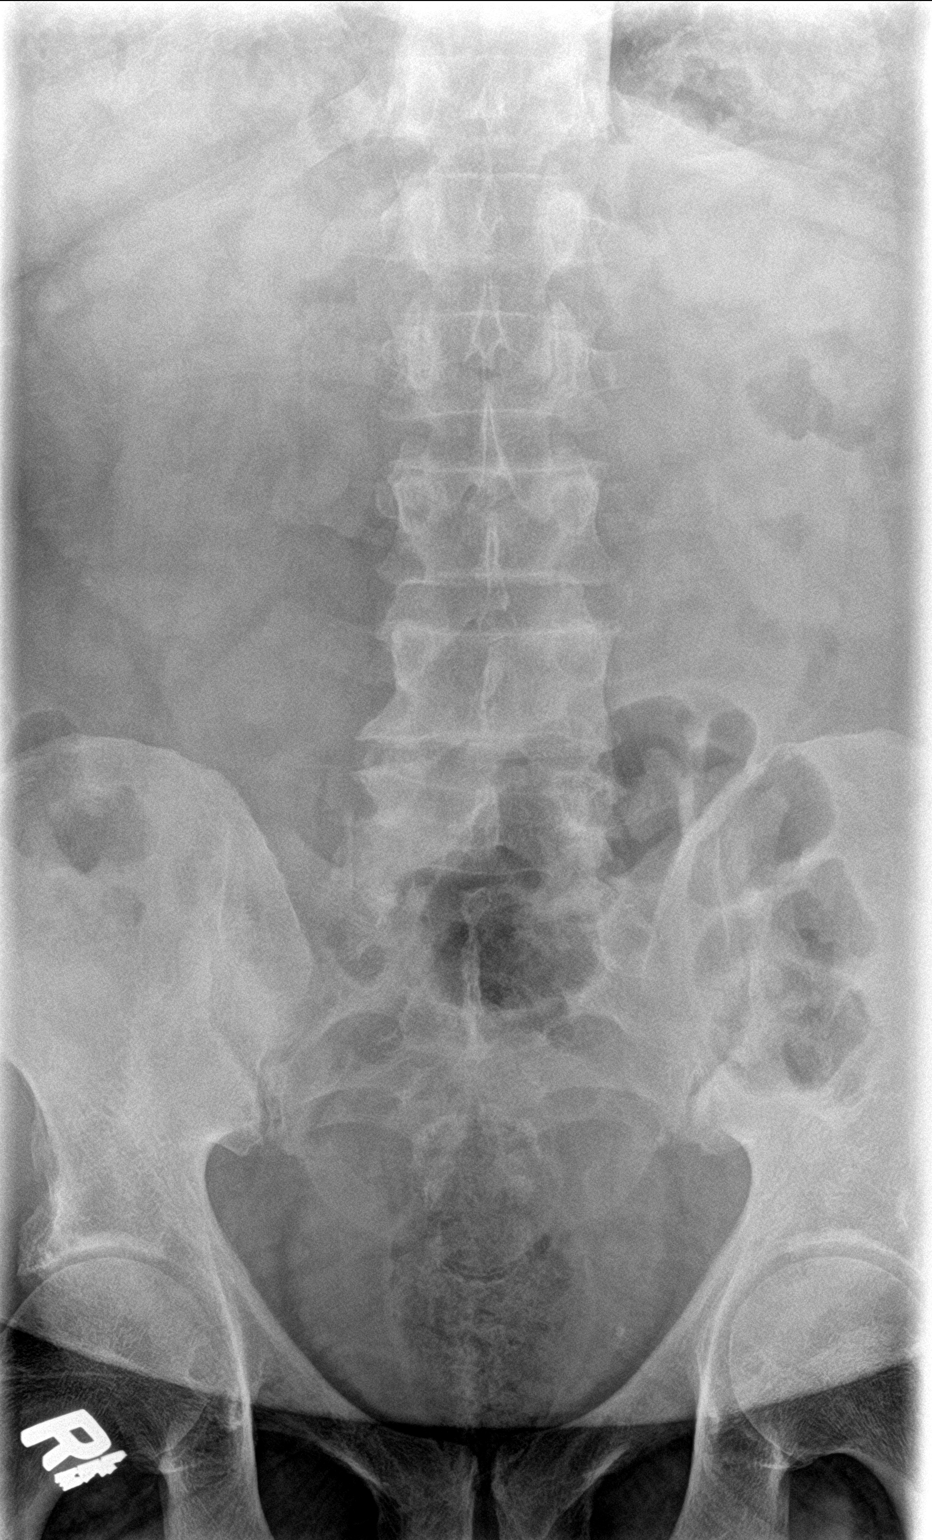
[im 2/3]
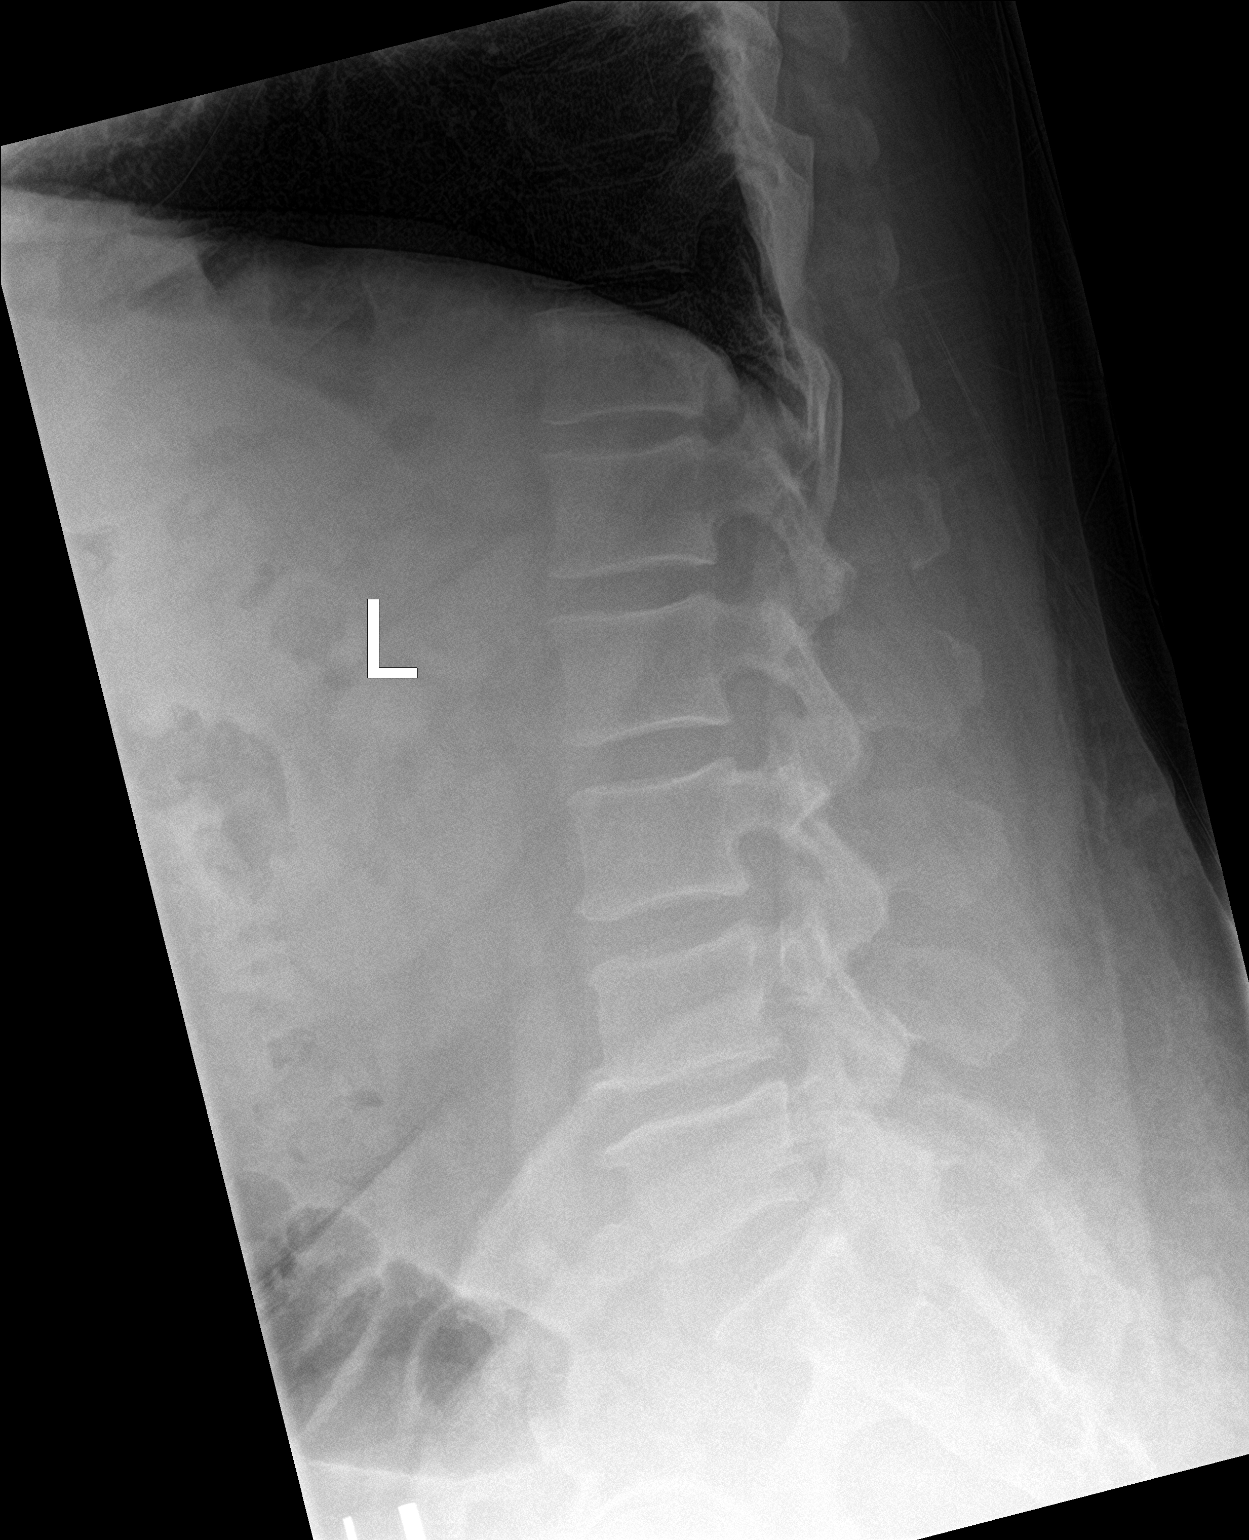
[im 3/3]
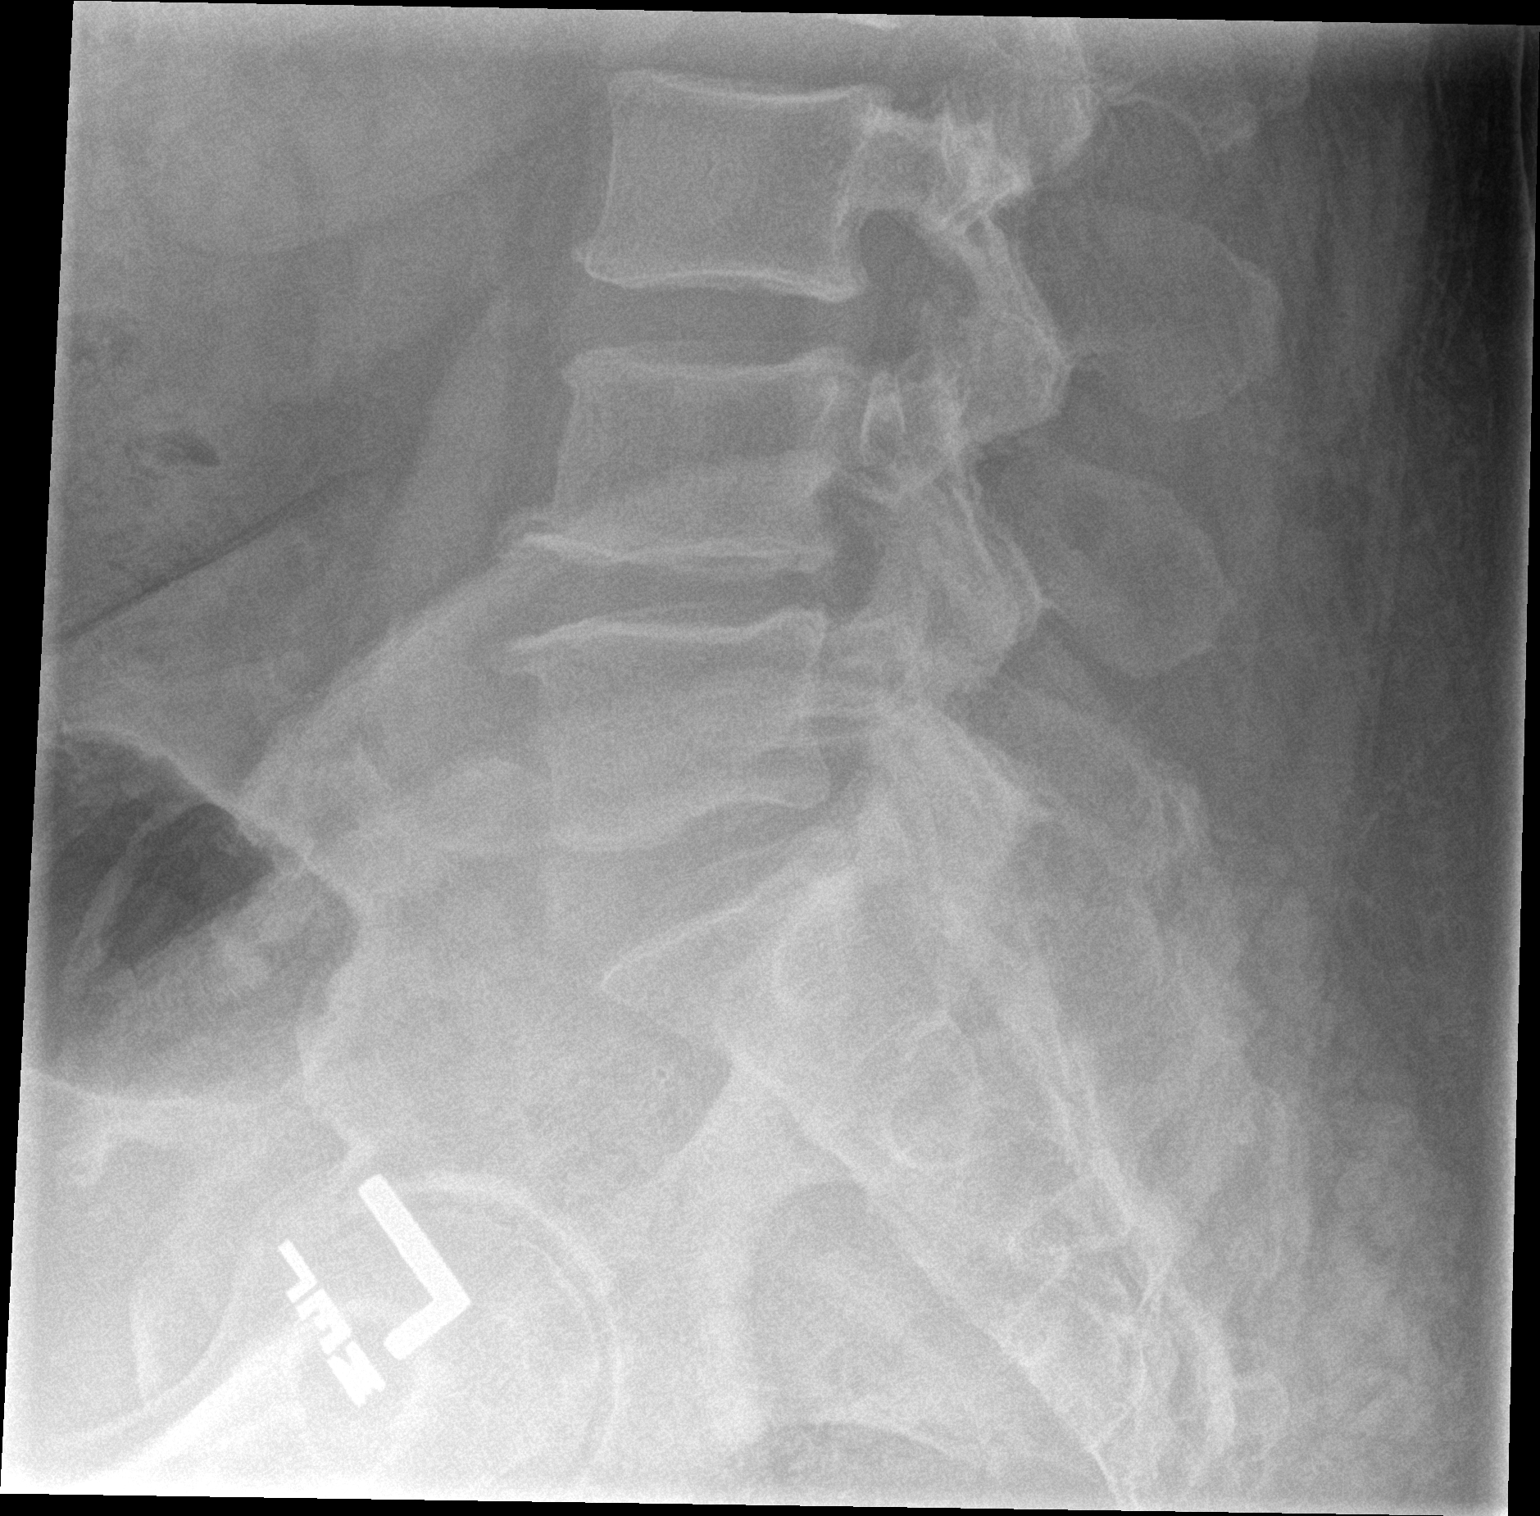

[3 of 3 positions shown; findings below may reference images not displayed]

FINDINGS: Alignment within normal limits. Vertebral body heights are
maintained. Mild degenerative changes at L4-L5.
IMPRESSION: Mild degenerative changes at L4-L5.

## 2020-11-23 IMAGING — CR DG KNEE 1-2V*R*
1 series · 2 of 2 positions shown · non-contrast
Comparison: None.

CLINICAL DATA: Pain and numbness in both legs

EXAM:
RIGHT KNEE - 1-2 VIEW

[Series 1: dg knee 1-2 views right · 0.14mm/px · 2 of 2 slices shown]
[im 1/2]
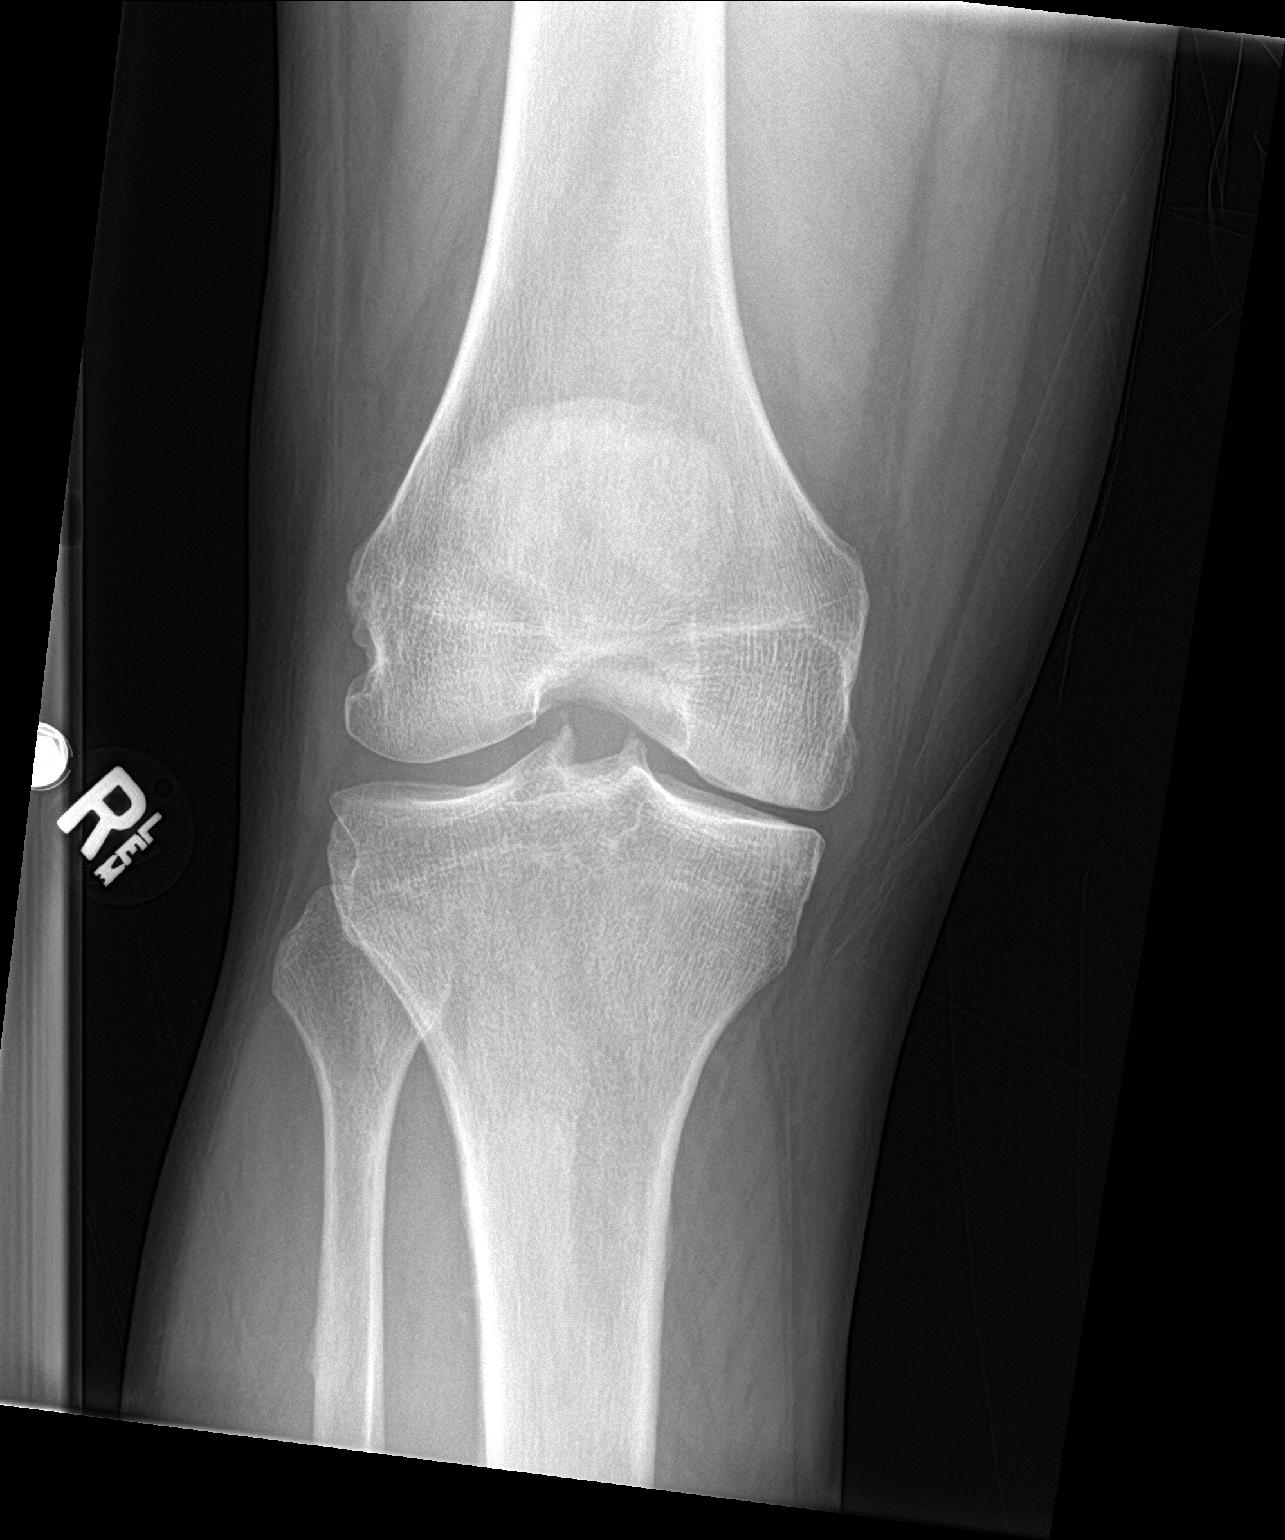
[im 2/2]
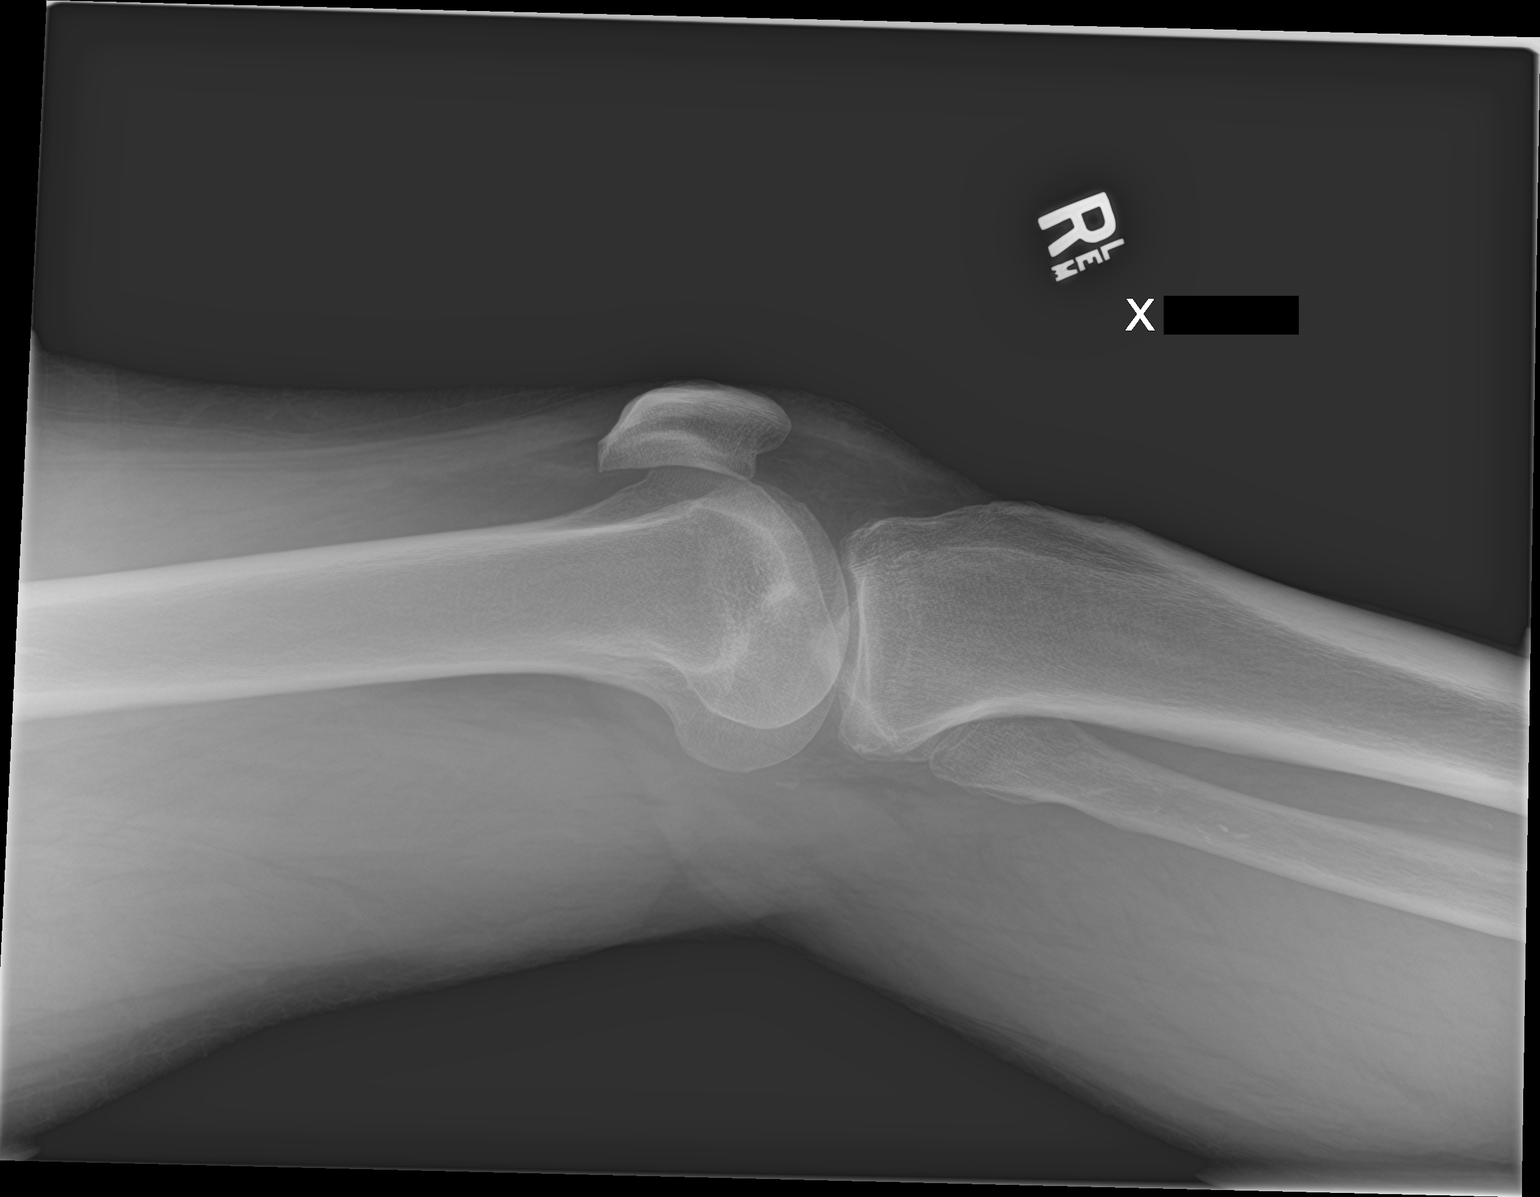

[2 of 2 positions shown; findings below may reference images not displayed]

FINDINGS: No fracture or malalignment. Mild medial joint space narrowing. No
large knee effusion.
IMPRESSION: Minimal degenerative change

## 2022-12-12 ENCOUNTER — Ambulatory Visit (INDEPENDENT_AMBULATORY_CARE_PROVIDER_SITE_OTHER): Payer: Medicaid Other | Admitting: Podiatry

## 2022-12-12 ENCOUNTER — Encounter: Payer: Self-pay | Admitting: Podiatry

## 2022-12-12 VITALS — Ht 74.0 in | Wt 255.0 lb

## 2022-12-12 DIAGNOSIS — E1142 Type 2 diabetes mellitus with diabetic polyneuropathy: Secondary | ICD-10-CM | POA: Diagnosis not present

## 2022-12-12 DIAGNOSIS — M79674 Pain in right toe(s): Secondary | ICD-10-CM

## 2022-12-12 DIAGNOSIS — B351 Tinea unguium: Secondary | ICD-10-CM | POA: Diagnosis not present

## 2022-12-12 DIAGNOSIS — M79675 Pain in left toe(s): Secondary | ICD-10-CM

## 2022-12-12 MED ORDER — GABAPENTIN 300 MG PO CAPS: 300.0000 mg | ORAL_CAPSULE | Freq: Every day | ORAL | 3 refills | Status: AC

## 2022-12-12 NOTE — Progress Notes (Signed)
  Subjective:  Patient ID: Lance Austin, male    DOB: 01/27/1966,  MRN: [id number]  Chief Complaint  Patient presents with   Peripheral Neuropathy    NP- [facility] and some neuropathy     57 y.o. male presents with the above complaint. History confirmed with patient. Patient presenting with pain related to dystrophic thickened elongated nails. Patient is unable to trim own nails related to nail dystrophy and/or mobility issues. Patient does have a history of T2DM. Patient also reports burning tingling pins and needle sensation in both feet does not take any medication for neuropathy.  Does notice some diminished sensation bilateral plantar forefoot pain. Objective:  Physical Exam: warm, good capillary refill nail exam onychomycosis of the toenails, onycholysis, and dystrophic nails DP pulses palpable, PT pulses palpable, and protective sensation absent Left Foot:  Pain with palpation of nails due to elongation and dystrophic growth.  Right Foot: Pain with palpation of nails due to elongation and dystrophic growth.   Assessment:   1. Pain due to onychomycosis of toenails of both feet   2. DM type 2 with diabetic peripheral neuropathy (HCC)      Plan:  Patient was evaluated and treated and all questions answered.  #DM2 with neuropathic pain -recommend gabapentin therapy -Erx for gabapentin 300 mg once daily at night for 90 days -Discussed risk and side effects associated with gabapentin including drowsiness and altered mental state  #Onychomycosis with pain  -Nails palliatively debrided as below. -Educated on self-care  Procedure: Nail Debridement Rationale: Pain Type of Debridement: manual, sharp debridement. Instrumentation: Nail nipper, rotary burr. Number of Nails: 10  Return in about 3 months (around 03/13/2023) for [facility].         Corinna Gab, DPM [facility]

## 2023-01-28 ENCOUNTER — Other Ambulatory Visit: Payer: Self-pay

## 2023-01-28 ENCOUNTER — Emergency Department (HOSPITAL_COMMUNITY): Payer: Medicaid Other

## 2023-01-28 ENCOUNTER — Emergency Department (HOSPITAL_COMMUNITY)
Admission: EM | Admit: 2023-01-28 | Discharge: 2023-01-28 | Disposition: A | Discharge status: Home / Self Care | Payer: Medicaid Other | Attending: Emergency Medicine | Admitting: Emergency Medicine

## 2023-01-28 ENCOUNTER — Encounter (HOSPITAL_COMMUNITY): Payer: Self-pay

## 2023-01-28 DIAGNOSIS — I10 Essential (primary) hypertension: Secondary | ICD-10-CM | POA: Diagnosis not present

## 2023-01-28 DIAGNOSIS — E119 Type 2 diabetes mellitus without complications: Secondary | ICD-10-CM | POA: Diagnosis not present

## 2023-01-28 DIAGNOSIS — R079 Chest pain, unspecified: Secondary | ICD-10-CM

## 2023-01-28 DIAGNOSIS — M546 Pain in thoracic spine: Secondary | ICD-10-CM | POA: Diagnosis not present

## 2023-01-28 DIAGNOSIS — R0789 Other chest pain: Secondary | ICD-10-CM | POA: Diagnosis present

## 2023-01-28 HISTORY — DX: Essential (primary) hypertension: I10

## 2023-01-28 HISTORY — DX: Type 2 diabetes mellitus without complications: E11.9

## 2023-01-28 HISTORY — DX: Unspecified osteoarthritis, unspecified site: M19.90

## 2023-01-28 LAB — CBC
HCT: 41.6 % (ref 39.0–52.0)
Hemoglobin: 13.7 g/dL (ref 13.0–17.0)
MCH: 30.8 pg (ref 26.0–34.0)
MCHC: 32.9 g/dL (ref 30.0–36.0)
MCV: 93.5 fL (ref 80.0–100.0)
Platelets: 317 10*3/uL (ref 150–400)
RBC: 4.45 MIL/uL (ref 4.22–5.81)
RDW: 13.6 % (ref 11.5–15.5)
WBC: 6.3 10*3/uL (ref 4.0–10.5)
nRBC: 0 % (ref 0.0–0.2)

## 2023-01-28 LAB — BASIC METABOLIC PANEL
Anion gap: 11 (ref 5–15)
BUN: 10 mg/dL (ref 6–20)
CO2: 25 mmol/L (ref 22–32)
Calcium: 9.4 mg/dL (ref 8.9–10.3)
Chloride: 96 mmol/L — ABNORMAL LOW (ref 98–111)
Creatinine, Ser: 0.87 mg/dL (ref 0.61–1.24)
GFR, Estimated: >60 mL/min (ref >60)
Glucose, Bld: 100 mg/dL — ABNORMAL HIGH (ref 70–99)
Potassium: 3.5 mmol/L (ref 3.5–5.1)
Sodium: 132 mmol/L — ABNORMAL LOW (ref 135–145)

## 2023-01-28 LAB — TROPONIN I (HIGH SENSITIVITY): Troponin I (High Sensitivity): 6 ng/L (ref <18)

## 2023-01-28 NOTE — Discharge Instructions (Signed)
Your workup here is reassuring.  There is no signs that this was a heart attack.  I think you pulled a muscle in your back.  Please follow-up with your family doctor in the office.  Take 4 over the counter ibuprofen tablets 3 times a day or 2 over-the-counter naproxen tablets twice a day for pain. Also take tylenol 1000mg (2 extra strength) four times a day.

## 2023-01-28 NOTE — ED Triage Notes (Signed)
Patient arrived POV from home with complaint of left chest pain that radiates into left shoulder and arm, reports pain worse at night, x 3-4 weeks.   Denies SOB, denies N/V/D  Pain 8/10

## 2023-01-28 NOTE — ED Provider Notes (Signed)
[place] [facility]GENCY DEPARTMENT AT [facility] Provider Note   CSN: [id number] Arrival date & time: 01/28/23  1959     History  Chief Complaint  Patient presents with   Chest Pain    Lance Austin is a 57 y.o. male.  57 yo M with chief complaints of left-sided back pain.  This is been going on for about a week.  Radiating to his neck into his chest.  Worse with movement twisting using of the left arm.  He denies injury.  Denies cough congestion or fever.  Patient denies history of MI.  Has a history of hypertension hyperlipidemia and diabetes and is an active every day smoker.  Denies family history of MI.  Patient denies history of PE or DVT denies hemoptysis denies unilateral lower extremity edema denies recent surgery immobilization hospitalization estrogen use or history of cancer.     Chest Pain      Home Medications Prior to Admission medications   Medication Sig Start Date End Date Taking? Authorizing Provider  Dulaglutide (TRULICITY) 0.75 MG/0.5ML SOPN Inject 0.75 mg into the skin once a week. 12/18/22  Yes [provider]  albuterol (VENTOLIN HFA) 108 (90 Base) MCG/ACT inhaler Inhale 2 puffs into the lungs every 4 (four) hours as needed for wheezing or shortness of breath.    [provider]  atorvastatin (LIPITOR) 20 MG tablet Take 20 mg by mouth daily. Take for 90 days    [provider]  gabapentin (NEURONTIN) 300 MG capsule Take 1 capsule (300 mg total) by mouth at bedtime. 12/12/22   Standiford, Jenelle Mages, DPM  ipratropium (ATROVENT) 0.06 % nasal spray Place 2 sprays into both nostrils 4 (four) times daily. 04/01/18   Eustace Moore, MD  losartan-hydrochlorothiazide (HYZAAR) 50-12.5 MG tablet Take 1 tablet by mouth daily.    [provider]  metFORMIN (GLUCOPHAGE-XR) 500 MG 24 hr tablet Take 500 mg by mouth daily.    [provider]  traMADol (ULTRAM) 50 MG tablet Take 1 tablet (50 mg total) by mouth  every 6 (six) hours as needed. 01/29/18   Mardella Layman, MD      Allergies    Patient has no known allergies.    Review of Systems   Review of Systems  Cardiovascular:  Positive for chest pain.    Physical Exam Updated Vital Signs BP (!) 141/89   Pulse 94   Temp 97.6 F (36.4 C) (Oral)   Resp 19   Ht 6\' 2"  (1.88 m)   Wt 111.6 kg   SpO2 96%   BMI 31.58 kg/m  Physical Exam Vitals and nursing note reviewed.  Constitutional:      Appearance: He is well-developed.  HENT:     Head: Normocephalic and atraumatic.  Eyes:     Pupils: Pupils are equal, round, and reactive to light.  Neck:     Vascular: No JVD.  Cardiovascular:     Rate and Rhythm: Normal rate and regular rhythm.     Heart sounds: No murmur heard.    No friction rub. No gallop.  Pulmonary:     Effort: No respiratory distress.     Breath sounds: No wheezing.  Chest:     Chest wall: Tenderness present.     Comments: Pain on palpation of the left anterior chest wall reproduces discomfort.  He has some pain along the left thoracic paraspinal musculature but also reproduces his discomfort.  Pulse motor and sensation intact of the left  upper extremity.  No midline spinal tenderness able to rotate his head without pain. Abdominal:     General: There is no distension.     Tenderness: There is no abdominal tenderness. There is no guarding or rebound.  Musculoskeletal:        General: Normal range of motion.     Cervical back: Normal range of motion and neck supple.  Skin:    Coloration: Skin is not pale.     Findings: No rash.  Neurological:     Mental Status: He is alert and oriented to person, place, and time.  Psychiatric:        Behavior: Behavior normal.     ED Results / Procedures / Treatments   Labs (all labs ordered are listed, but only abnormal results are displayed) Labs Reviewed  BASIC METABOLIC PANEL - Abnormal; Notable for the following components:      Result Value   Sodium 132 (*)     Chloride 96 (*)    Glucose, Bld 100 (*)    All other components within normal limits  CBC  TROPONIN I (HIGH SENSITIVITY)    EKG EKG Interpretation Date/Time:  Wednesday January 28 2023 20:12:25 EST Ventricular Rate:  90 PR Interval:  134 QRS Duration:  76 QT Interval:  370 QTC Calculation: 452 R Axis:   7  Text Interpretation: Normal sinus rhythm Nonspecific T wave abnormality Abnormal ECG No significant change since last tracing Confirmed by Melene Plan [phone]) on 01/28/2023 9:10:30 PM  Radiology DG Chest 2 View  Result Date: 01/28/2023 CLINICAL DATA:  Left-sided chest pain a radiates into the left shoulder. EXAM: CHEST - 2 VIEW COMPARISON:  April 01, 2018 FINDINGS: The heart size and mediastinal contours are within normal limits. Mild calcification of the aortic arch is seen. There is no evidence of acute infiltrate, pleural effusion or pneumothorax. The visualized skeletal structures are unremarkable. IMPRESSION: No active cardiopulmonary disease. Electronically Signed   By: Aram Candela M.D.   On: 01/28/2023 21:38    Procedures Procedures  Discussed smoking cessation with patient and was they were offerred resources to help stop.  Total time was 5 min CPT code 65784.     Medications Ordered in ED Medications - No data to display  ED Course/ Medical Decision Making/ A&P                                 Medical Decision Making Amount and/or Complexity of Data Reviewed Labs: ordered. Radiology: ordered.   57 yo M with a chief complaints of left-sided back pain that radiates to the chest.  Going on for the past week.  Reproduced on exam.  Nonexertional.  Most likely musculoskeletal by history and physical.  Plain film of the chest independently interpreted by me without focal infiltrate or pneumothorax.  No significant electrolyte abnormalities.  No anemia.  Troponin negative.  EKG is nonischemic.  Will treat as musculoskeletal.  PCP follow-up.  9:49 PM:  I have  discussed the diagnosis/risks/treatment options with the patient and family.  Evaluation and diagnostic testing in the emergency department does not suggest an emergent condition requiring admission or immediate intervention beyond what has been performed at this time.  They will follow up with PCP. We also discussed returning to the ED immediately if new or worsening sx occur. We discussed the sx which are most concerning (e.g., sudden worsening pain, fever, inability to tolerate by mouth)  that necessitate immediate return. Medications administered to the patient during their visit and any new prescriptions provided to the patient are listed below.  Medications given during this visit Medications - No data to display   The patient appears reasonably screen and/or stabilized for discharge and I doubt any other medical condition or other [facility] requiring further screening, evaluation, or treatment in the ED at this time prior to discharge.          Final Clinical Impression(s) / ED Diagnoses Final diagnoses:  Nonspecific chest pain    Rx / DC Orders ED Discharge Orders     None         Melene Plan, DO 01/28/23 2149

## 2023-04-06 NOTE — Therapy (Unsigned)
OUTPATIENT PHYSICAL THERAPY CERVICAL EVALUATION   Patient Name: Lance Austin MRN: [id number] DOB:[birth year], 58 y.o., male Today's Date: 04/07/2023  END OF SESSION:  PT End of Session - 04/07/23 1310     Visit Number 1    Number of Visits 4    Date for PT Re-Evaluation 06/05/23    Authorization Type [employer]    PT Start Time 1215    PT Stop Time 1300    PT Time Calculation (min) 45 min    Activity Tolerance Patient limited by pain    Behavior During Therapy WFL for tasks assessed/performed             Past Medical History:  Diagnosis Date   Arthritis    Diabetes mellitus without complication (HCC)    Hypertension    Past Surgical History:  Procedure Laterality Date   right arm surgery     There are no active problems to display for this patient.   PCP: [facility] [place], Kela Millin   REFERRING PROVIDER: Aurelio Brash, FNP  REFERRING DIAG: Bilateral /upper back /left shoulder pain after exercise  THERAPY DIAG:  Acute pain of left shoulder - Plan: PT plan of care cert/re-cert  Muscle weakness (generalized) - Plan: PT plan of care cert/re-cert  Rationale for Evaluation and Treatment: Rehabilitation  ONSET DATE: 2 months   SUBJECTIVE:                                                                                                                                                                                                         SUBJECTIVE STATEMENT: Relates a 2 month history of L shoulder/scapular pain, affecting sleep patterns.  X-rays ordered but patient but patient has not obtained them. Hand dominance: Right  PERTINENT HISTORY:  I am referring you to a physical therapist to help with back/shoulder discomfort & for an x-ray of neck/upper back.  If you have not received any information about your referral in 10-14 days, please call our office [phone].  PAIN:  Are you having pain? Yes: NPRS scale: 10/10 Pain location:  L shoulder  Pain description: ache Aggravating factors: lying on L side Relieving factors: pain meds  PRECAUTIONS: None  RED FLAGS: None     WEIGHT BEARING RESTRICTIONS: No  FALLS:  Has patient fallen in last 6 months? No  OCCUPATION: not working  PLOF: Independent  PATIENT GOALS: To manage my shoulder symptoms  NEXT MD VISIT: TBD  OBJECTIVE:  Note: Objective measures were completed at Evaluation unless otherwise noted.  DIAGNOSTIC FINDINGS:  None  recent  PATIENT SURVEYS:  FOTO 58(65 predicted)  POSTURE: rounded shoulders and forward head, depressed and forward L shoulder.  PALPATION: Palpation finds soft tissue nodules to L thoracic spine only minimally tender to palpation.     CERVICAL ROM:   Active ROM A/PROM (deg) eval  Flexion 25%  Extension 0%  Right lateral flexion 25%  Left lateral flexion 50%  Right rotation 50%  Left rotation 50%   (Blank rows = not tested)  UPPER EXTREMITY ROM:  Active ROM Right eval Left eval  Shoulder flexion 160 160  Shoulder extension    Shoulder abduction 160 160  Shoulder adduction    Shoulder extension    Shoulder internal rotation    Shoulder external rotation    Elbow flexion    Elbow extension    Wrist flexion    Wrist extension    Wrist ulnar deviation    Wrist radial deviation    Wrist pronation    Wrist supination     (Blank rows = not tested)  UPPER EXTREMITY MMT: WFL limited by pain  MMT Right eval Left eval  Shoulder flexion    Shoulder extension    Shoulder abduction    Shoulder adduction    Shoulder extension    Shoulder internal rotation    Shoulder external rotation    Middle trapezius    Lower trapezius    Elbow flexion    Elbow extension    Wrist flexion    Wrist extension    Wrist ulnar deviation    Wrist radial deviation    Wrist pronation    Wrist supination    Grip strength     (Blank rows = not tested)  SHOULDER SPECIAL TESTS: Impingement tests: Neer impingement  test: negative, Hawkins/Kennedy impingement test: negative, and Painful arc test: negative Rotator cuff assessment: Drop arm test: negative and Empty can test: negative  FUNCTIONAL TESTS:  30 seconds chair stand test 1  TREATMENT DATE:  04/07/23 Eval                                                                                                                             PATIENT EDUCATION:  Education details: Discussed eval findings, rehab rationale and POC and patient is in agreement  Person educated: Patient Education method: Explanation Education comprehension: verbalized understanding and needs further education  HOME EXERCISE PROGRAM: Attempted L UT stretch and L rhomboid stretch but patient unable to tolerate  ASSESSMENT:  CLINICAL IMPRESSION: Patient is a 58 y.o. male who was seen today for physical therapy evaluation and treatment for L shoulder and scapular pain of 2 month duration.  He cannot relate distinct aggravating or relieving factors but is unable to lie on L side.  No imaging studies to date.  L shoulder ROM and strength functional and RC pathology unlikely.  Pain avoidance behavior limited scope of assessment making differential dx difficult.  Recommend patient f/u on imaging studies to assist in  determining underlying cause of symptoms.  OBJECTIVE IMPAIRMENTS: decreased activity tolerance, decreased knowledge of condition, decreased ROM, decreased strength, increased muscle spasms, impaired UE functional use, postural dysfunction, obesity, and pain.   ACTIVITY LIMITATIONS: carrying, lifting, sleeping, bed mobility, and reach over head  PERSONAL FACTORS: Age, Fitness, and 1-2 comorbidities: DM, "arthritis"  are also affecting patient's functional outcome.   REHAB POTENTIAL: Good  CLINICAL DECISION MAKING: Stable/uncomplicated  EVALUATION COMPLEXITY: Low   GOALS: Goals reviewed with patient? No  SHORT TERM GOALS: Target date: 05/05/2023  Patient to  demonstrate independence in HEP  Baseline: TBD Goal status: INITIAL  2.  Patient will acknowledge 6/10 pain at least once during episode of care   Baseline: 10/10 Goal status: INITIAL  3.  Patient will score at least 65% on FOTO to signify clinically meaningful improvement in functional abilities.   Baseline: 58% Goal status: INITIAL  4.  Patient will increase 30s chair stand reps from 1 to 4 with/without arms to demonstrate and improved functional ability with less pain/difficulty as well as reduce fall risk.  Baseline: 1 Goal status: INITIAL   PLAN:  PT FREQUENCY: 1-2x/week  PT DURATION: 6 weeks  PLANNED INTERVENTIONS: 97164- PT Re-evaluation, 97110-Therapeutic exercises, 97530- Therapeutic activity, 97112- Neuromuscular re-education, 97535- Self Care, 16109- Manual therapy, Dry Needling, and Joint mobilization  PLAN FOR NEXT SESSION: HEP review and update, manual techniques as appropriate, aerobic tasks, ROM and flexibility activities, strengthening and PREs, TPDN, gait and balance training as needed   For all possible CPT codes, reference the Planned Interventions line above.     Check all conditions that are expected to impact treatment: {Conditions expected to impact treatment:Diabetes mellitus   If treatment provided at initial evaluation, no treatment charged due to lack of authorization.       Hildred Laser, PT 04/07/2023, 1:12 PM

## 2023-04-07 ENCOUNTER — Ambulatory Visit: Payer: Medicaid Other | Attending: Registered Nurse

## 2023-04-07 ENCOUNTER — Other Ambulatory Visit: Payer: Self-pay

## 2023-04-07 DIAGNOSIS — M25512 Pain in left shoulder: Secondary | ICD-10-CM | POA: Insufficient documentation

## 2023-04-07 DIAGNOSIS — M6281 Muscle weakness (generalized): Secondary | ICD-10-CM | POA: Diagnosis present

## 2023-04-14 NOTE — Therapy (Deleted)
 OUTPATIENT PHYSICAL THERAPY CERVICAL EVALUATION   Patient Name: Lance Austin MRN: [id number] DOB:[birth year], 58 y.o., male Today's Date: 04/14/2023  END OF SESSION:    Past Medical History:  Diagnosis Date   Arthritis    Diabetes mellitus without complication (HCC)    Hypertension    Past Surgical History:  Procedure Laterality Date   right arm surgery     There are no active problems to display for this patient.   PCP: [facility] , Kela Millin   REFERRING PROVIDER: Aurelio Brash, FNP  REFERRING DIAG: Bilateral /upper back /left shoulder pain after exercise  THERAPY DIAG:  No diagnosis found.  Rationale for Evaluation and Treatment: Rehabilitation  ONSET DATE: 2 months   SUBJECTIVE:                                                                                                                                                                                                         SUBJECTIVE STATEMENT: Relates a 2 month history of L shoulder/scapular pain, affecting sleep patterns.  X-rays ordered but patient but patient has not obtained them. Hand dominance: Right  PERTINENT HISTORY:  I am referring you to a physical therapist to help with back/shoulder discomfort & for an x-ray of neck/upper back.  If you have not received any information about your referral in 10-14 days, please call our office [phone].  PAIN:  Are you having pain? Yes: NPRS scale: 10/10 Pain location: L shoulder  Pain description: ache Aggravating factors: lying on L side Relieving factors: pain meds  PRECAUTIONS: None  RED FLAGS: None     WEIGHT BEARING RESTRICTIONS: No  FALLS:  Has patient fallen in last 6 months? No  OCCUPATION: not working  PLOF: Independent  PATIENT GOALS: To manage my shoulder symptoms  NEXT MD VISIT: TBD  OBJECTIVE:  Note: Objective measures were completed at Evaluation unless otherwise noted.  DIAGNOSTIC FINDINGS:   None recent  PATIENT SURVEYS:  FOTO 58(65 predicted)  POSTURE: rounded shoulders and forward head, depressed and forward L shoulder.  PALPATION: Palpation finds soft tissue nodules to L thoracic spine only minimally tender to palpation.     CERVICAL ROM:   Active ROM A/PROM (deg) eval  Flexion 25%  Extension 0%  Right lateral flexion 25%  Left lateral flexion 50%  Right rotation 50%  Left rotation 50%   (Blank rows = not tested)  UPPER EXTREMITY ROM:  Active ROM Right eval Left eval  Shoulder flexion 160 160  Shoulder extension    Shoulder abduction 160 160  Shoulder adduction  Shoulder extension    Shoulder internal rotation    Shoulder external rotation    Elbow flexion    Elbow extension    Wrist flexion    Wrist extension    Wrist ulnar deviation    Wrist radial deviation    Wrist pronation    Wrist supination     (Blank rows = not tested)  UPPER EXTREMITY MMT: WFL limited by pain  MMT Right eval Left eval  Shoulder flexion    Shoulder extension    Shoulder abduction    Shoulder adduction    Shoulder extension    Shoulder internal rotation    Shoulder external rotation    Middle trapezius    Lower trapezius    Elbow flexion    Elbow extension    Wrist flexion    Wrist extension    Wrist ulnar deviation    Wrist radial deviation    Wrist pronation    Wrist supination    Grip strength     (Blank rows = not tested)  SHOULDER SPECIAL TESTS: Impingement tests: Neer impingement test: negative, Hawkins/Kennedy impingement test: negative, and Painful arc test: negative Rotator cuff assessment: Drop arm test: negative and Empty can test: negative  FUNCTIONAL TESTS:  30 seconds chair stand test 1  TREATMENT DATE:  04/07/23 Eval                                                                                                                             PATIENT EDUCATION:  Education details: Discussed eval findings, rehab rationale and POC  and patient is in agreement  Person educated: Patient Education method: Explanation Education comprehension: verbalized understanding and needs further education  HOME EXERCISE PROGRAM: Attempted L UT stretch and L rhomboid stretch but patient unable to tolerate  ASSESSMENT:  CLINICAL IMPRESSION: Patient is a 58 y.o. male who was seen today for physical therapy evaluation and treatment for L shoulder and scapular pain of 2 month duration.  He cannot relate distinct aggravating or relieving factors but is unable to lie on L side.  No imaging studies to date.  L shoulder ROM and strength functional and RC pathology unlikely.  Pain avoidance behavior limited scope of assessment making differential dx difficult.  Recommend patient f/u on imaging studies to assist in determining underlying cause of symptoms.  OBJECTIVE IMPAIRMENTS: decreased activity tolerance, decreased knowledge of condition, decreased ROM, decreased strength, increased muscle spasms, impaired UE functional use, postural dysfunction, obesity, and pain.   ACTIVITY LIMITATIONS: carrying, lifting, sleeping, bed mobility, and reach over head  PERSONAL FACTORS: Age, Fitness, and 1-2 comorbidities: DM, "arthritis"  are also affecting patient's functional outcome.   REHAB POTENTIAL: Good  CLINICAL DECISION MAKING: Stable/uncomplicated  EVALUATION COMPLEXITY: Low   GOALS: Goals reviewed with patient? No  SHORT TERM GOALS: Target date: 05/05/2023  Patient to demonstrate independence in HEP  Baseline: TBD Goal status: INITIAL  2.  Patient will acknowledge 6/10 pain at  least once during episode of care   Baseline: 10/10 Goal status: INITIAL  3.  Patient will score at least 65% on FOTO to signify clinically meaningful improvement in functional abilities.   Baseline: 58% Goal status: INITIAL  4.  Patient will increase 30s chair stand reps from 1 to 4 with/without arms to demonstrate and improved functional ability with less  pain/difficulty as well as reduce fall risk.  Baseline: 1 Goal status: INITIAL   PLAN:  PT FREQUENCY: 1-2x/week  PT DURATION: 6 weeks  PLANNED INTERVENTIONS: 97164- PT Re-evaluation, 97110-Therapeutic exercises, 97530- Therapeutic activity, 97112- Neuromuscular re-education, 97535- Self Care, 16109- Manual therapy, Dry Needling, and Joint mobilization  PLAN FOR NEXT SESSION: HEP review and update, manual techniques as appropriate, aerobic tasks, ROM and flexibility activities, strengthening and PREs, TPDN, gait and balance training as needed   For all possible CPT codes, reference the Planned Interventions line above.     Check all conditions that are expected to impact treatment: {Conditions expected to impact treatment:Diabetes mellitus   If treatment provided at initial evaluation, no treatment charged due to lack of authorization.       Hildred Laser, PT 04/14/2023, 2:29 PM

## 2023-04-16 ENCOUNTER — Ambulatory Visit: Payer: Medicaid Other

## 2023-04-16 ENCOUNTER — Telehealth: Payer: Self-pay

## 2023-04-16 NOTE — Telephone Encounter (Signed)
TC due to missed visit.  Number rand several times then went to busy signal, unable to leave VM

## 2023-04-16 NOTE — Therapy (Deleted)
 OUTPATIENT PHYSICAL THERAPY CERVICAL EVALUATION   Patient Name: Lance Austin MRN: [id number] DOB:[birth year], 58 y.o., male Today's Date: 04/16/2023  END OF SESSION:    Past Medical History:  Diagnosis Date   Arthritis    Diabetes mellitus without complication (HCC)    Hypertension    Past Surgical History:  Procedure Laterality Date   right arm surgery     There are no active problems to display for this patient.   PCP: [facility] [place], Kela Millin   REFERRING PROVIDER: Aurelio Brash, FNP  REFERRING DIAG: Bilateral /upper back /left shoulder pain after exercise  THERAPY DIAG:  No diagnosis found.  Rationale for Evaluation and Treatment: Rehabilitation  ONSET DATE: 2 months   SUBJECTIVE:                                                                                                                                                                                                         SUBJECTIVE STATEMENT: Relates a 2 month history of L shoulder/scapular pain, affecting sleep patterns.  X-rays ordered but patient but patient has not obtained them. Hand dominance: Right  PERTINENT HISTORY:  I am referring you to a physical therapist to help with back/shoulder discomfort & for an x-ray of neck/upper back.  If you have not received any information about your referral in 10-14 days, please call our office [phone].  PAIN:  Are you having pain? Yes: NPRS scale: 10/10 Pain location: L shoulder  Pain description: ache Aggravating factors: lying on L side Relieving factors: pain meds  PRECAUTIONS: None  RED FLAGS: None     WEIGHT BEARING RESTRICTIONS: No  FALLS:  Has patient fallen in last 6 months? No  OCCUPATION: not working  PLOF: Independent  PATIENT GOALS: To manage my shoulder symptoms  NEXT MD VISIT: TBD  OBJECTIVE:  Note: Objective measures were completed at Evaluation unless otherwise noted.  DIAGNOSTIC FINDINGS:   None recent  PATIENT SURVEYS:  FOTO 58(65 predicted)  POSTURE: rounded shoulders and forward head, depressed and forward L shoulder.  PALPATION: Palpation finds soft tissue nodules to L thoracic spine only minimally tender to palpation.     CERVICAL ROM:   Active ROM A/PROM (deg) eval  Flexion 25%  Extension 0%  Right lateral flexion 25%  Left lateral flexion 50%  Right rotation 50%  Left rotation 50%   (Blank rows = not tested)  UPPER EXTREMITY ROM:  Active ROM Right eval Left eval  Shoulder flexion 160 160  Shoulder extension    Shoulder abduction 160 160  Shoulder adduction  Shoulder extension    Shoulder internal rotation    Shoulder external rotation    Elbow flexion    Elbow extension    Wrist flexion    Wrist extension    Wrist ulnar deviation    Wrist radial deviation    Wrist pronation    Wrist supination     (Blank rows = not tested)  UPPER EXTREMITY MMT: WFL limited by pain  MMT Right eval Left eval  Shoulder flexion    Shoulder extension    Shoulder abduction    Shoulder adduction    Shoulder extension    Shoulder internal rotation    Shoulder external rotation    Middle trapezius    Lower trapezius    Elbow flexion    Elbow extension    Wrist flexion    Wrist extension    Wrist ulnar deviation    Wrist radial deviation    Wrist pronation    Wrist supination    Grip strength     (Blank rows = not tested)  SHOULDER SPECIAL TESTS: Impingement tests: Neer impingement test: negative, Hawkins/Kennedy impingement test: negative, and Painful arc test: negative Rotator cuff assessment: Drop arm test: negative and Empty can test: negative  FUNCTIONAL TESTS:  30 seconds chair stand test 1  TREATMENT DATE:  04/07/23 Eval                                                                                                                             PATIENT EDUCATION:  Education details: Discussed eval findings, rehab rationale and POC  and patient is in agreement  Person educated: Patient Education method: Explanation Education comprehension: verbalized understanding and needs further education  HOME EXERCISE PROGRAM: Attempted L UT stretch and L rhomboid stretch but patient unable to tolerate  ASSESSMENT:  CLINICAL IMPRESSION: Patient is a 58 y.o. male who was seen today for physical therapy evaluation and treatment for L shoulder and scapular pain of 2 month duration.  He cannot relate distinct aggravating or relieving factors but is unable to lie on L side.  No imaging studies to date.  L shoulder ROM and strength functional and RC pathology unlikely.  Pain avoidance behavior limited scope of assessment making differential dx difficult.  Recommend patient f/u on imaging studies to assist in determining underlying cause of symptoms.  OBJECTIVE IMPAIRMENTS: decreased activity tolerance, decreased knowledge of condition, decreased ROM, decreased strength, increased muscle spasms, impaired UE functional use, postural dysfunction, obesity, and pain.   ACTIVITY LIMITATIONS: carrying, lifting, sleeping, bed mobility, and reach over head  PERSONAL FACTORS: Age, Fitness, and 1-2 comorbidities: DM, "arthritis"  are also affecting patient's functional outcome.   REHAB POTENTIAL: Good  CLINICAL DECISION MAKING: Stable/uncomplicated  EVALUATION COMPLEXITY: Low   GOALS: Goals reviewed with patient? No  SHORT TERM GOALS: Target date: 05/05/2023  Patient to demonstrate independence in HEP  Baseline: TBD Goal status: INITIAL  2.  Patient will acknowledge 6/10 pain at  least once during episode of care   Baseline: 10/10 Goal status: INITIAL  3.  Patient will score at least 65% on FOTO to signify clinically meaningful improvement in functional abilities.   Baseline: 58% Goal status: INITIAL  4.  Patient will increase 30s chair stand reps from 1 to 4 with/without arms to demonstrate and improved functional ability with less  pain/difficulty as well as reduce fall risk.  Baseline: 1 Goal status: INITIAL   PLAN:  PT FREQUENCY: 1-2x/week  PT DURATION: 6 weeks  PLANNED INTERVENTIONS: 97164- PT Re-evaluation, 97110-Therapeutic exercises, 97530- Therapeutic activity, 97112- Neuromuscular re-education, 97535- Self Care, 16109- Manual therapy, Dry Needling, and Joint mobilization  PLAN FOR NEXT SESSION: HEP review and update, manual techniques as appropriate, aerobic tasks, ROM and flexibility activities, strengthening and PREs, TPDN, gait and balance training as needed   For all possible CPT codes, reference the Planned Interventions line above.     Check all conditions that are expected to impact treatment: {Conditions expected to impact treatment:Diabetes mellitus   If treatment provided at initial evaluation, no treatment charged due to lack of authorization.       Hildred Laser, PT 04/16/2023, 1:40 PM

## 2023-04-20 ENCOUNTER — Telehealth: Payer: Self-pay

## 2023-04-20 ENCOUNTER — Ambulatory Visit: Payer: Medicaid Other | Attending: Registered Nurse

## 2023-04-20 NOTE — Telephone Encounter (Signed)
TC due to missed visit.  Spoke directly to patient who related he did not have transportation and was awaiting his x-ray appointment.  Reminded patient of next scheduled visit as well as attendance policy.

## 2023-04-22 ENCOUNTER — Ambulatory Visit
Admission: RE | Admit: 2023-04-22 | Discharge: 2023-04-22 | Disposition: A | Discharge status: Home / Self Care | Payer: Medicaid Other | Source: Ambulatory Visit | Attending: Registered Nurse | Admitting: Registered Nurse

## 2023-04-22 ENCOUNTER — Other Ambulatory Visit: Payer: Self-pay | Admitting: Registered Nurse

## 2023-04-22 DIAGNOSIS — G629 Polyneuropathy, unspecified: Secondary | ICD-10-CM

## 2023-04-26 NOTE — Therapy (Deleted)
 OUTPATIENT PHYSICAL THERAPY CERVICAL EVALUATION   Patient Name: Lance Austin MRN: [id number] DOB:1966/01/06, 57 y.o., male Today's Date: 04/26/2023  END OF SESSION:    Past Medical History:  Diagnosis Date   Arthritis    Diabetes mellitus without complication (HCC)    Hypertension    Past Surgical History:  Procedure Laterality Date   right arm surgery     There are no active problems to display for this patient.   PCP: [facility] [place], Kela Millin   REFERRING PROVIDER: Aurelio Brash, FNP  REFERRING DIAG: Bilateral /upper back /left shoulder pain after exercise  THERAPY DIAG:  No diagnosis found.  Rationale for Evaluation and Treatment: Rehabilitation  ONSET DATE: 2 months   SUBJECTIVE:                                                                                                                                                                                                         SUBJECTIVE STATEMENT: Relates a 2 month history of L shoulder/scapular pain, affecting sleep patterns.  X-rays ordered but patient but patient has not obtained them. Hand dominance: Right  PERTINENT HISTORY:  I am referring you to a physical therapist to help with back/shoulder discomfort & for an x-ray of neck/upper back.  If you have not received any information about your referral in 10-14 days, please call our office [phone].  PAIN:  Are you having pain? Yes: NPRS scale: 10/10 Pain location: L shoulder  Pain description: ache Aggravating factors: lying on L side Relieving factors: pain meds  PRECAUTIONS: None  RED FLAGS: None     WEIGHT BEARING RESTRICTIONS: No  FALLS:  Has patient fallen in last 6 months? No  OCCUPATION: not working  PLOF: Independent  PATIENT GOALS: To manage my shoulder symptoms  NEXT MD VISIT: TBD  OBJECTIVE:  Note: Objective measures were completed at Evaluation unless otherwise noted.  DIAGNOSTIC FINDINGS:   None recent  PATIENT SURVEYS:  FOTO 58(65 predicted)  POSTURE: rounded shoulders and forward head, depressed and forward L shoulder.  PALPATION: Palpation finds soft tissue nodules to L thoracic spine only minimally tender to palpation.     CERVICAL ROM:   Active ROM A/PROM (deg) eval  Flexion 25%  Extension 0%  Right lateral flexion 25%  Left lateral flexion 50%  Right rotation 50%  Left rotation 50%   (Blank rows = not tested)  UPPER EXTREMITY ROM:  Active ROM Right eval Left eval  Shoulder flexion 160 160  Shoulder extension    Shoulder abduction 160 160  Shoulder adduction  Shoulder extension    Shoulder internal rotation    Shoulder external rotation    Elbow flexion    Elbow extension    Wrist flexion    Wrist extension    Wrist ulnar deviation    Wrist radial deviation    Wrist pronation    Wrist supination     (Blank rows = not tested)  UPPER EXTREMITY MMT: WFL limited by pain  MMT Right eval Left eval  Shoulder flexion    Shoulder extension    Shoulder abduction    Shoulder adduction    Shoulder extension    Shoulder internal rotation    Shoulder external rotation    Middle trapezius    Lower trapezius    Elbow flexion    Elbow extension    Wrist flexion    Wrist extension    Wrist ulnar deviation    Wrist radial deviation    Wrist pronation    Wrist supination    Grip strength     (Blank rows = not tested)  SHOULDER SPECIAL TESTS: Impingement tests: Neer impingement test: negative, Hawkins/Kennedy impingement test: negative, and Painful arc test: negative Rotator cuff assessment: Drop arm test: negative and Empty can test: negative  FUNCTIONAL TESTS:  30 seconds chair stand test 1  TREATMENT DATE:  04/07/23 Eval                                                                                                                             PATIENT EDUCATION:  Education details: Discussed eval findings, rehab rationale and POC  and patient is in agreement  Person educated: Patient Education method: Explanation Education comprehension: verbalized understanding and needs further education  HOME EXERCISE PROGRAM: Attempted L UT stretch and L rhomboid stretch but patient unable to tolerate  ASSESSMENT:  CLINICAL IMPRESSION: Patient is a 58 y.o. male who was seen today for physical therapy evaluation and treatment for L shoulder and scapular pain of 2 month duration.  He cannot relate distinct aggravating or relieving factors but is unable to lie on L side.  No imaging studies to date.  L shoulder ROM and strength functional and RC pathology unlikely.  Pain avoidance behavior limited scope of assessment making differential dx difficult.  Recommend patient f/u on imaging studies to assist in determining underlying cause of symptoms.  OBJECTIVE IMPAIRMENTS: decreased activity tolerance, decreased knowledge of condition, decreased ROM, decreased strength, increased muscle spasms, impaired UE functional use, postural dysfunction, obesity, and pain.   ACTIVITY LIMITATIONS: carrying, lifting, sleeping, bed mobility, and reach over head  PERSONAL FACTORS: Age, Fitness, and 1-2 comorbidities: DM, "arthritis"  are also affecting patient's functional outcome.   REHAB POTENTIAL: Good  CLINICAL DECISION MAKING: Stable/uncomplicated  EVALUATION COMPLEXITY: Low   GOALS: Goals reviewed with patient? No  SHORT TERM GOALS: Target date: 05/05/2023  Patient to demonstrate independence in HEP  Baseline: TBD Goal status: INITIAL  2.  Patient will acknowledge 6/10 pain at  least once during episode of care   Baseline: 10/10 Goal status: INITIAL  3.  Patient will score at least 65% on FOTO to signify clinically meaningful improvement in functional abilities.   Baseline: 58% Goal status: INITIAL  4.  Patient will increase 30s chair stand reps from 1 to 4 with/without arms to demonstrate and improved functional ability with less  pain/difficulty as well as reduce fall risk.  Baseline: 1 Goal status: INITIAL   PLAN:  PT FREQUENCY: 1-2x/week  PT DURATION: 6 weeks  PLANNED INTERVENTIONS: 97164- PT Re-evaluation, 97110-Therapeutic exercises, 97530- Therapeutic activity, 97112- Neuromuscular re-education, 97535- Self Care, 65784- Manual therapy, Dry Needling, and Joint mobilization  PLAN FOR NEXT SESSION: HEP review and update, manual techniques as appropriate, aerobic tasks, ROM and flexibility activities, strengthening and PREs, TPDN, gait and balance training as needed   For all possible CPT codes, reference the Planned Interventions line above.     Check all conditions that are expected to impact treatment: {Conditions expected to impact treatment:Diabetes mellitus   If treatment provided at initial evaluation, no treatment charged due to lack of authorization.       Hildred Laser, PT 04/26/2023, 8:20 AM

## 2023-04-27 ENCOUNTER — Ambulatory Visit: Payer: Medicaid Other

## 2023-05-08 ENCOUNTER — Other Ambulatory Visit: Payer: Self-pay

## 2023-05-08 ENCOUNTER — Encounter (HOSPITAL_COMMUNITY): Payer: Self-pay

## 2023-05-08 ENCOUNTER — Emergency Department (HOSPITAL_COMMUNITY)
Admission: EM | Admit: 2023-05-08 | Discharge: 2023-05-09 | Discharge status: Against Medical Advice | Payer: Medicaid Other | Attending: Emergency Medicine | Admitting: Emergency Medicine

## 2023-05-08 DIAGNOSIS — M25512 Pain in left shoulder: Secondary | ICD-10-CM | POA: Insufficient documentation

## 2023-05-08 DIAGNOSIS — Z5321 Procedure and treatment not carried out due to patient leaving prior to being seen by health care provider: Secondary | ICD-10-CM | POA: Insufficient documentation

## 2023-05-08 MED ORDER — LIDOCAINE 5 % EX PTCH
1.0000 | MEDICATED_PATCH | CUTANEOUS | Status: DC
  Administered 2023-05-08: 1 via TRANSDERMAL
  Filled 2023-05-08: qty 1

## 2023-05-08 MED ORDER — HYDROCODONE-ACETAMINOPHEN 5-325 MG PO TABS
1.0000 | ORAL_TABLET | Freq: Once | ORAL | Status: AC
  Administered 2023-05-08: 1 via ORAL
  Filled 2023-05-08: qty 1

## 2023-05-08 NOTE — ED Provider Triage Note (Signed)
 Emergency Medicine Provider Triage Evaluation Note  Lance Austin , a 58 y.o. male  was evaluated in triage.  Pt complains of left shoulder/trapezius pain for the past month.  Patient states he pulled a muscle and was seen and evaluated however is on gabapentin and taking Tylenol no relief.  Patient had an x-ray done of his spine by primary care provider but has not had this read yet.  Patient denies any chest pain, mops this, shortness of breath and states that the pain is a muscle spasm and that he needs pain meds.  Review of Systems  Positive:  Negative:   Physical Exam  BP 133/88 (BP Location: Right Arm)   Pulse (!) 107   Temp (!) 97.3 F (36.3 C) (Oral)   Resp 18   SpO2 100%  Gen:   Awake, no distress   Resp:  Normal effort  MSK:   Moves extremities without difficulty  Other:  No midline tenderness noted, patient tender along his left trapezius muscle  Medical Decision Making  Medically screening exam initiated at 9:56 PM.  Appropriate orders placed.  Lance Austin was informed that the remainder of the evaluation will be completed by another provider, this initial triage assessment does not replace that evaluation, and the importance of remaining in the ED until their evaluation is complete.  Workup initiated, patient given pain meds here, I was able to trace patient's left trapezius muscle as he states with the pain is suspicious of MSK pain.  Patient was not endorsing any cardiopulmonary symptoms and so no labs and imaging were ordered.   Lance Austin, Lance Austin 05/08/23 2157

## 2023-05-08 NOTE — ED Triage Notes (Signed)
 Patient c/o neck, back, and shoulder pain ongoing for 2 months, patient has been seen and is starting PT. Here today b/c the pain is unbearable.

## 2023-05-09 NOTE — ED Notes (Signed)
 Pt did not answer call for vital signs

## 2023-05-09 NOTE — ED Notes (Signed)
Pt called multiple times, no response  

## 2023-05-10 ENCOUNTER — Emergency Department (HOSPITAL_COMMUNITY): Payer: Medicaid Other

## 2023-05-10 ENCOUNTER — Encounter (HOSPITAL_COMMUNITY): Payer: Self-pay | Admitting: *Deleted

## 2023-05-10 ENCOUNTER — Emergency Department (HOSPITAL_COMMUNITY)
Admission: EM | Admit: 2023-05-10 | Discharge: 2023-05-10 | Disposition: A | Discharge status: Home / Self Care | Payer: Medicaid Other | Attending: Emergency Medicine | Admitting: Emergency Medicine

## 2023-05-10 ENCOUNTER — Other Ambulatory Visit: Payer: Self-pay

## 2023-05-10 DIAGNOSIS — M25512 Pain in left shoulder: Secondary | ICD-10-CM | POA: Insufficient documentation

## 2023-05-10 MED ORDER — NAPROXEN 375 MG PO TABS: 375.0000 mg | ORAL_TABLET | Freq: Two times a day (BID) | ORAL | 0 refills | Status: AC

## 2023-05-10 MED ORDER — OXYCODONE-ACETAMINOPHEN 5-325 MG PO TABS
1.0000 | ORAL_TABLET | Freq: Once | ORAL | Status: AC
  Administered 2023-05-10: 1 via ORAL
  Filled 2023-05-10: qty 1

## 2023-05-10 MED ORDER — LIDOCAINE 5 % EX PTCH
1.0000 | MEDICATED_PATCH | CUTANEOUS | Status: DC
  Administered 2023-05-10: 1 via TRANSDERMAL
  Filled 2023-05-10: qty 1

## 2023-05-10 MED ORDER — CYCLOBENZAPRINE HCL 10 MG PO TABS: 5.0000 mg | ORAL_TABLET | Freq: Two times a day (BID) | ORAL | 0 refills | Status: AC | PRN

## 2023-05-10 NOTE — ED Triage Notes (Signed)
 The pt has had lt shoulder and lt arm pain for 6 months  he was here last pm but did not wait to be seen  he reports that he was given a vicodin and that helped the pain

## 2023-05-10 NOTE — ED Provider Notes (Signed)
 [place] EMERGENCY DEPARTMENT AT [facility] Provider Note   CSN: [id number] Arrival date & time: 05/10/23  7829     History  Chief Complaint  Patient presents with   Shoulder Pain    Lance Austin is a 58 y.o. male who presents emergency department chief complaint of left shoulder pain.  Patient reports that pain is in his shoulder blade region radiates to his neck and his pectoralis region.  He has no significant joint pain.  Has been going on for 3 months.  He denies numbness tingling.   Shoulder Pain      Home Medications Prior to Admission medications   Medication Sig Start Date End Date Taking? Authorizing Provider  acetaminophen (TYLENOL) 650 MG CR tablet Take 650 mg by mouth every 8 (eight) hours as needed for pain.    [provider]  albuterol (VENTOLIN HFA) 108 (90 Base) MCG/ACT inhaler Inhale 2 puffs into the lungs every 4 (four) hours as needed for wheezing or shortness of breath.    [provider]  atorvastatin (LIPITOR) 20 MG tablet Take 20 mg by mouth daily. Take for 90 days    [provider]  Dulaglutide (TRULICITY) 0.75 MG/0.5ML SOPN Inject 0.75 mg into the skin once a week. Inject on Sunday 12/18/22   [provider]  gabapentin (NEURONTIN) 300 MG capsule Take 1 capsule (300 mg total) by mouth at bedtime. 12/12/22   Standiford, Jenelle Mages, DPM  losartan-hydrochlorothiazide (HYZAAR) 50-12.5 MG tablet Take 1 tablet by mouth daily.    [provider]  metFORMIN (GLUCOPHAGE-XR) 500 MG 24 hr tablet Take 500 mg by mouth daily.    [provider]      Allergies    Patient has no known allergies.    Review of Systems   Review of Systems  Physical Exam Updated Vital Signs BP 126/80   Pulse (!) 108   Temp (!) 97.4 F (36.3 C)   Resp 20   Ht 6\' 2"  (1.88 m)   Wt 108.9 kg   SpO2 97%   BMI 30.82 kg/m  Physical Exam Vitals and nursing note reviewed.  Constitutional:      General: He is not  in acute distress.    Appearance: He is well-developed. He is not diaphoretic.  HENT:     Head: Normocephalic and atraumatic.  Eyes:     General: No scleral icterus.    Conjunctiva/sclera: Conjunctivae normal.  Cardiovascular:     Rate and Rhythm: Normal rate and regular rhythm.     Heart sounds: Normal heart sounds.  Pulmonary:     Effort: Pulmonary effort is normal. No respiratory distress.     Breath sounds: Normal breath sounds.  Abdominal:     Palpations: Abdomen is soft.     Tenderness: There is no abdominal tenderness.  Musculoskeletal:     Cervical back: Normal range of motion and neck supple.     Comments: Full range of motion of the left shoulder, normal strength, tenderness along the medial scapular border, multiple areas of spastic and tender tissue.  Skin:    General: Skin is warm and dry.  Neurological:     Mental Status: He is alert.  Psychiatric:        Behavior: Behavior normal.     ED Results / Procedures / Treatments   Labs (all labs ordered are listed, but only abnormal results are displayed) Labs Reviewed - No data to display  EKG None  Radiology No  results found.  Procedures Procedures    Medications Ordered in ED Medications  oxyCODONE-acetaminophen (PERCOCET/ROXICET) 5-325 MG per tablet 1 tablet (1 tablet Oral Given 05/10/23 0221)    ED Course/ Medical Decision Making/ A&P                                 Medical Decision Making Amount and/or Complexity of Data Reviewed Radiology: ordered.  Risk Prescription drug management.   58 year old male here with left shoulder region pain, I visualized and interpreted a left shoulder x-ray which shows no acute findings.  Patient's pain has been there for 3 months.  Patient states that he just "could not take it tonight."  He had no abrupt changes.  Patient's left shoulder blade pain is not consistent with aortic dissection, ACS, pulmonary embolus or other life-threatening cause.  It is  reproducible with palpation of the shoulder region and movement of the shoulder blade.  Patient has already been undergoing physical therapy for the same thing diagnosed by his primary care physician.  He is notably tachycardic however I think this is mostly likely due to pain.  Patient given lidocaine patch.  Anti-inflammatories, and muscle relaxers, follow-up with PCP, discussed return precautions.        Final Clinical Impression(s) / ED Diagnoses Final diagnoses:  None    Rx / DC Orders ED Discharge Orders     None         Arthor Captain, PA-C 05/10/23 2001    Palumbo, April, MD 05/12/23 2314

## 2023-05-10 NOTE — Discharge Instructions (Addendum)
 You have trigger points in your shoulder region. I would advise using over the counter lidocaine patch.  Your primary care provider may be able to refer you to physical therapy for eval of your shoulder pain and treatment. \] I am discharging you with a muscle relaxer and antiinflammatory.  Get help right away if: Your arm, hand, or fingers turn white or blue.

## 2023-06-02 ENCOUNTER — Other Ambulatory Visit: Payer: Self-pay | Admitting: Orthopedic Surgery

## 2023-06-02 DIAGNOSIS — M5412 Radiculopathy, cervical region: Secondary | ICD-10-CM

## 2023-06-10 ENCOUNTER — Ambulatory Visit
Admission: RE | Admit: 2023-06-10 | Discharge: 2023-06-10 | Discharge status: Home / Self Care | Source: Ambulatory Visit | Attending: Orthopedic Surgery | Admitting: Orthopedic Surgery

## 2023-06-10 DIAGNOSIS — M5412 Radiculopathy, cervical region: Secondary | ICD-10-CM

## 2023-06-15 ENCOUNTER — Other Ambulatory Visit: Payer: Self-pay | Admitting: Orthopedic Surgery

## 2023-06-15 ENCOUNTER — Encounter: Payer: Self-pay | Admitting: Orthopedic Surgery

## 2023-06-15 DIAGNOSIS — R079 Chest pain, unspecified: Secondary | ICD-10-CM

## 2023-06-15 DIAGNOSIS — R0781 Pleurodynia: Secondary | ICD-10-CM

## 2023-06-15 DIAGNOSIS — M542 Cervicalgia: Secondary | ICD-10-CM

## 2023-06-19 ENCOUNTER — Ambulatory Visit
Admission: RE | Admit: 2023-06-19 | Discharge: 2023-06-19 | Disposition: A | Discharge status: Home / Self Care | Source: Ambulatory Visit | Attending: Orthopedic Surgery | Admitting: Orthopedic Surgery

## 2023-06-19 DIAGNOSIS — R079 Chest pain, unspecified: Secondary | ICD-10-CM

## 2023-06-19 DIAGNOSIS — M542 Cervicalgia: Secondary | ICD-10-CM

## 2023-06-25 ENCOUNTER — Inpatient Hospital Stay: Admission: RE | Admit: 2023-06-25 | Source: Ambulatory Visit

## 2023-06-25 ENCOUNTER — Other Ambulatory Visit

## 2023-07-16 DEATH — deceased
